# Patient Record
Sex: Male | Born: 1990 | Race: Black or African American | Hispanic: No | Marital: Married | Smoking: Current some day smoker
Health system: Southern US, Community
[De-identification: ages and names within clinical notes are randomized; demographics above are authoritative.]

## PROBLEM LIST (undated history)

## (undated) DIAGNOSIS — I10 Essential (primary) hypertension: Secondary | ICD-10-CM

---
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
## 2011-02-07 LAB — RAPID STREP SCREEN (MED CTR MEBANE ONLY): Streptococcus, Group A Screen (Direct): NEGATIVE

## 2012-02-29 ENCOUNTER — Emergency Department (HOSPITAL_COMMUNITY): Payer: Self-pay

## 2012-02-29 ENCOUNTER — Emergency Department (HOSPITAL_COMMUNITY)
Admission: EM | Admit: 2012-02-29 | Discharge: 2012-02-29 | Disposition: A | Discharge status: Home / Self Care | Payer: Self-pay | Attending: Emergency Medicine | Admitting: Emergency Medicine

## 2012-02-29 ENCOUNTER — Encounter (HOSPITAL_COMMUNITY): Payer: Self-pay | Admitting: Emergency Medicine

## 2012-02-29 DIAGNOSIS — F17200 Nicotine dependence, unspecified, uncomplicated: Secondary | ICD-10-CM | POA: Insufficient documentation

## 2012-02-29 DIAGNOSIS — J069 Acute upper respiratory infection, unspecified: Secondary | ICD-10-CM | POA: Insufficient documentation

## 2012-02-29 DIAGNOSIS — J40 Bronchitis, not specified as acute or chronic: Secondary | ICD-10-CM | POA: Insufficient documentation

## 2012-02-29 MED ORDER — AEROCHAMBER Z-STAT PLUS/MEDIUM MISC
1.0000 | Freq: Once | Status: AC
  Administered 2012-02-29: 1

## 2012-02-29 MED ORDER — ALBUTEROL SULFATE HFA 108 (90 BASE) MCG/ACT IN AERS
2.0000 | INHALATION_SPRAY | RESPIRATORY_TRACT | Status: DC | PRN
  Filled 2012-02-29: qty 6.7

## 2012-02-29 NOTE — ED Notes (Signed)
Pt presents w/ cough and cold Sx since Wednesday, has Rx'ed w/ cough syrup and chest congestion pills w/o relief. States feels like chest is "gonna explode when I cough", nasal secretions are yellowish green in color w/ recent frequent nose bleeds. Denies sorethroat, headache. Left ear pain when he blows his nose.

## 2012-02-29 NOTE — ED Provider Notes (Signed)
Medical screening examination/treatment/procedure(s) were performed by non-physician practitioner and as supervising physician I was immediately available for consultation/collaboration.  Raeford Razor, MD 02/29/12 [phone]

## 2012-02-29 NOTE — ED Provider Notes (Signed)
History     CSN: [id number]  Arrival date & time 02/29/12  1340   First MD Initiated Contact with Patient 02/29/12 1413      Chief Complaint  Patient presents with  . Cough  . Nasal Congestion    (Consider location/radiation/quality/duration/timing/severity/associated sxs/prior treatment) HPI Comments: Patient presents with complaint of productive cough, chest pain, and runny nose for the past 3 days. Patient has been taking over-the-counter cough medications and cold medications without relief. Patient states he has noted a small amount of blood when he blows his nose. No sore throat or headache. No nausea or vomiting. No fever. Left ear pain but only when he blows his nose. Onset gradual. Course is constant. Nothing makes symptoms better or worse.  The history is provided by the patient.    History reviewed. No pertinent past medical history.  History reviewed. No pertinent past surgical history.  No family history on file.  History  Substance Use Topics  . Smoking status: Current Every Day Smoker -- 0.5 packs/day    Types: Cigarettes  . Smokeless tobacco: Never Used  . Alcohol Use: 9.6 oz/week    16 Shots of liquor per week     vodka      Review of Systems  Constitutional: Negative for fever, chills and fatigue.  HENT: Positive for ear pain, nosebleeds, congestion and rhinorrhea. Negative for sore throat, neck stiffness and sinus pressure.   Eyes: Negative for redness.  Respiratory: Positive for cough and chest tightness. Negative for shortness of breath and wheezing.   Gastrointestinal: Negative for nausea, vomiting, abdominal pain and diarrhea.  Genitourinary: Negative for dysuria.  Musculoskeletal: Negative for myalgias.  Skin: Negative for rash.  Neurological: Negative for headaches.  Hematological: Negative for adenopathy.    Allergies  Other  Home Medications  No current outpatient prescriptions on file.  BP 136/82  Pulse 93  Temp 98.6 F (37 C)  (Oral)  SpO2 95%  Physical Exam  Nursing note and vitals reviewed. Constitutional: He appears well-developed and well-nourished.  HENT:  Head: Normocephalic and atraumatic. No trismus in the jaw.  Right Ear: Tympanic membrane, external ear and ear canal normal.  Left Ear: Tympanic membrane, external ear and ear canal normal.  Nose: Mucosal edema and rhinorrhea present.  Mouth/Throat: Uvula is midline, oropharynx is clear and moist and mucous membranes are normal. Mucous membranes are not dry. No uvula swelling. No oropharyngeal exudate, posterior oropharyngeal edema, posterior oropharyngeal erythema or tonsillar abscesses.  Eyes: Conjunctivae normal are normal. Right eye exhibits no discharge. Left eye exhibits no discharge.  Neck: Normal range of motion. Neck supple.  Cardiovascular: Normal rate, regular rhythm and normal heart sounds.   Pulmonary/Chest: Effort normal. No respiratory distress. He has wheezes. He has no rales.       Mild rhonchi and wheezing, worst left lower lobe.  Abdominal: Soft. There is no tenderness.  Neurological: He is alert.  Skin: Skin is warm and dry.  Psychiatric: He has a normal mood and affect.    ED Course  Procedures (including critical care time)  Labs Reviewed - No data to display Dg Chest 2 View  02/29/2012  *RADIOLOGY REPORT*  Clinical Data: Cough, cold symptoms  CHEST - 2 VIEW  Comparison: None.  Findings: Cardiomediastinal silhouette is unremarkable.  No acute infiltrate or pleural effusion.  No pulmonary edema.  Accessory azygos fissure/lobe is noted.  IMPRESSION: No active disease.   Original Report Authenticated By: Natasha Mead, M.D.      1.  Bronchitis   2. URI (upper respiratory infection)     2:26 PM Patient seen and examined. CXR ordered to eval PNA, LLL.   Vital signs reviewed and are as follows: Filed Vitals:   02/29/12 1406  BP: 136/82  Pulse: 93  Temp: 98.6 F (37 C)   X-ray/CT reviewed by myself. Radiologist report  reviewed. No acute process.   Patient counseled on use of albuterol HFA.  Told to use 1-2 puffs q 4 hours as needed for SOB.  Patient counseled on supportive care for viral URI and s/s to return including worsening symptoms, persistent fever, persistent vomiting, or if they have any other concerns.  Urged to see PCP if symptoms persist for more than 3 days. Patient verbalizes understanding and agrees with plan.     MDM  Patient with likely viral bronchitis. No concern for PNA given x-ray. Antibiotics not indicated. Conservative therapy indicated.           Renne Crigler, [place] 02/29/12 1513

## 2012-04-19 ENCOUNTER — Emergency Department (HOSPITAL_COMMUNITY)
Admission: EM | Admit: 2012-04-19 | Discharge: 2012-04-19 | Disposition: A | Discharge status: Home / Self Care | Payer: Self-pay | Attending: Emergency Medicine | Admitting: Emergency Medicine

## 2012-04-19 ENCOUNTER — Encounter (HOSPITAL_COMMUNITY): Payer: Self-pay

## 2012-04-19 ENCOUNTER — Emergency Department (HOSPITAL_COMMUNITY): Payer: Self-pay

## 2012-04-19 DIAGNOSIS — Y9289 Other specified places as the place of occurrence of the external cause: Secondary | ICD-10-CM | POA: Insufficient documentation

## 2012-04-19 DIAGNOSIS — F17200 Nicotine dependence, unspecified, uncomplicated: Secondary | ICD-10-CM | POA: Insufficient documentation

## 2012-04-19 DIAGNOSIS — M25511 Pain in right shoulder: Secondary | ICD-10-CM

## 2012-04-19 DIAGNOSIS — Y9351 Activity, roller skating (inline) and skateboarding: Secondary | ICD-10-CM | POA: Insufficient documentation

## 2012-04-19 DIAGNOSIS — S46909A Unspecified injury of unspecified muscle, fascia and tendon at shoulder and upper arm level, unspecified arm, initial encounter: Secondary | ICD-10-CM | POA: Insufficient documentation

## 2012-04-19 DIAGNOSIS — S4980XA Other specified injuries of shoulder and upper arm, unspecified arm, initial encounter: Secondary | ICD-10-CM | POA: Insufficient documentation

## 2012-04-19 MED ORDER — NAPROXEN 500 MG PO TABS: 500.0000 mg | ORAL_TABLET | Freq: Two times a day (BID) | ORAL | Status: DC

## 2012-04-19 NOTE — ED Notes (Signed)
Patient reports that he was roller skating and fell, landing on his right arm. Positive right radial pulse. Able to wiggle right fingers without any problems.

## 2012-04-19 NOTE — ED Provider Notes (Signed)
History     CSN: [id number]  Arrival date & time 04/19/12  1746   First MD Initiated Contact with Patient 04/19/12 1817      Chief Complaint  Patient presents with  . Arm Injury    (Consider location/radiation/quality/duration/timing/severity/associated sxs/prior treatment) HPI Alan Lamb is a 21 y.o. male who presents with complaint of right shoulder pain. Stats he was roller skating at a rink yesterday and states he stripped over another person and fell down landing on right shoulder. States pain to the shoulder, pain with ROM of the shoulder. Denies pain in elbow or wrist. Did not hit his head. Did not ice or take anything for the pain.   History reviewed. No pertinent past medical history.  History reviewed. No pertinent past surgical history.  No family history on file.  History  Substance Use Topics  . Smoking status: Current Some Day Smoker -- 0.5 packs/day    Types: Cigarettes  . Smokeless tobacco: Never Used  . Alcohol Use: 0.0 oz/week     Comment: occasional      Review of Systems  Constitutional: Negative for fever and chills.  HENT: Negative for neck pain.   Respiratory: Negative.   Cardiovascular: Negative.   Musculoskeletal: Positive for arthralgias. Negative for joint swelling.  Neurological: Negative for weakness and numbness.    Allergies  Other  Home Medications  No current outpatient prescriptions on file.  BP 139/69  Pulse 81  Temp 98.4 F (36.9 C) (Oral)  Resp 16  SpO2 100%  Physical Exam  Nursing note and vitals reviewed. Constitutional: He appears well-developed and well-nourished. No distress.  Eyes: Conjunctivae normal are normal.  Neck: Normal range of motion. Neck supple.  Cardiovascular: Normal rate, regular rhythm and normal heart sounds.   Pulmonary/Chest: Effort normal and breath sounds normal. No respiratory distress. He has no wheezes. He has no rales.  Musculoskeletal:       Right shoulder normal in appearance.  No bruising, swelling, deformity. Tender to palpation over trapezius muscle, anterior and posterior joints. Pain with ROM of the right tshoulder joint in all directions as well as with internal and external rotation. Full ROM of the joint. Good strength of bicep, triceps, deltoid muscle. Radial pulse normal.   Neurological: He is alert.  Skin: Skin is warm and dry.    ED Course  Procedures (including critical care time)  Labs Reviewed - No data to display Dg Shoulder Right  04/19/2012  *RADIOLOGY REPORT*  Clinical Data: Right shoulder pain.  RIGHT SHOULDER - 2+ VIEW  Comparison: None  Findings: The joint spaces are maintained.  No acute bony findings or abnormal soft tissue calcifications.  The right lung apex is clear.  IMPRESSION: No fracture or dislocation.   Original Report Authenticated By: Rudie Meyer, M.D.      1. Right shoulder pain       MDM  Pt  Post fall yesterday. Today shoulder pain, joint stable. X-ray negative. Will treat with Ice, NSAIDs, follow up as needed.         Lottie Mussel, PA 04/19/12 2342

## 2012-04-22 NOTE — ED Provider Notes (Signed)
Medical screening examination/treatment/procedure(s) were performed by non-physician practitioner and as supervising physician I was immediately available for consultation/collaboration.   Adir Schicker H Savahna Casados, MD 04/22/12 1105 

## 2014-03-04 ENCOUNTER — Emergency Department (HOSPITAL_COMMUNITY)
Admission: EM | Admit: 2014-03-04 | Discharge: 2014-03-04 | Disposition: A | Discharge status: Home / Self Care | Payer: Self-pay | Attending: Emergency Medicine | Admitting: Emergency Medicine

## 2014-03-04 ENCOUNTER — Encounter (HOSPITAL_COMMUNITY): Payer: Self-pay | Admitting: Emergency Medicine

## 2014-03-04 DIAGNOSIS — R109 Unspecified abdominal pain: Secondary | ICD-10-CM | POA: Insufficient documentation

## 2014-03-04 DIAGNOSIS — Z720 Tobacco use: Secondary | ICD-10-CM | POA: Insufficient documentation

## 2014-03-04 DIAGNOSIS — R1110 Vomiting, unspecified: Secondary | ICD-10-CM | POA: Insufficient documentation

## 2014-03-04 NOTE — ED Notes (Signed)
Pt is c/o abd pain on the left side that started about 1 pm Thursday afternoon  Pt states he had one episode of vomiting  Describes the pain as sharp in nature and varies in intensity from 6-9 on pain scale

## 2014-03-04 NOTE — ED Notes (Signed)
Pt still not in room or restroom, will discharge.

## 2014-03-04 NOTE — ED Notes (Signed)
Pt not in room, will recheck shortly to ask for urine sample

## 2014-05-18 ENCOUNTER — Emergency Department (HOSPITAL_COMMUNITY)
Admission: EM | Admit: 2014-05-18 | Discharge: 2014-05-19 | Disposition: A | Discharge status: Home / Self Care | Payer: Self-pay | Attending: Emergency Medicine | Admitting: Emergency Medicine

## 2014-05-18 DIAGNOSIS — K0889 Other specified disorders of teeth and supporting structures: Secondary | ICD-10-CM

## 2014-05-18 DIAGNOSIS — K088 Other specified disorders of teeth and supporting structures: Secondary | ICD-10-CM | POA: Insufficient documentation

## 2014-05-18 DIAGNOSIS — Z7982 Long term (current) use of aspirin: Secondary | ICD-10-CM | POA: Insufficient documentation

## 2014-05-18 DIAGNOSIS — Z720 Tobacco use: Secondary | ICD-10-CM | POA: Insufficient documentation

## 2014-05-19 ENCOUNTER — Encounter (HOSPITAL_COMMUNITY): Payer: Self-pay

## 2014-05-19 MED ORDER — IBUPROFEN 600 MG PO TABS: 600.0000 mg | ORAL_TABLET | Freq: Four times a day (QID) | ORAL | Status: DC | PRN

## 2014-05-19 MED ORDER — AMOXICILLIN 500 MG PO CAPS: 500.0000 mg | ORAL_CAPSULE | Freq: Three times a day (TID) | ORAL | Status: DC

## 2014-05-19 MED ORDER — HYDROCODONE-ACETAMINOPHEN 5-325 MG PO TABS: 1.0000 | ORAL_TABLET | ORAL | Status: DC | PRN

## 2014-05-19 MED ORDER — OXYCODONE-ACETAMINOPHEN 5-325 MG PO TABS
1.0000 | ORAL_TABLET | Freq: Once | ORAL | Status: AC
  Administered 2014-05-19: 1 via ORAL
  Filled 2014-05-19: qty 1

## 2014-05-19 NOTE — ED Provider Notes (Signed)
CSN: [id number]     Arrival date & time 05/18/14  2352 History   First MD Initiated Contact with Patient 05/19/14 0006     Chief Complaint  Patient presents with  . Dental Pain     (Consider location/radiation/quality/duration/timing/severity/associated sxs/prior Treatment) HPI Alan Lamb is a 24 y.o. male with no medical problems presents to emergency department complaining of dental pain. He states pain started several days ago. States pain is in the left lower molar, radiating all over left face. Denies any fever or chills. No facial swelling. No swelling of the tongue. Has been taking aspirin for pain with no relief. He does not have a dentist. No fever or chills. No dental injuries. No other complaints.  History reviewed. No pertinent past medical history. History reviewed. No pertinent past surgical history. Family History  Problem Relation Age of Onset  . Hypertension Other    History  Substance Use Topics  . Smoking status: Current Some Day Smoker -- 0.50 packs/day    Types: Cigarettes  . Smokeless tobacco: Never Used  . Alcohol Use: 0.0 oz/week     Comment: occasional    Review of Systems  Constitutional: Negative for fever and chills.  HENT: Positive for dental problem. Negative for facial swelling.   Respiratory: Negative for cough, chest tightness and shortness of breath.   Cardiovascular: Negative for chest pain, palpitations and leg swelling.  Genitourinary: Negative for hematuria.  Musculoskeletal: Negative for myalgias, arthralgias, neck pain and neck stiffness.  Skin: Negative for rash.  Allergic/Immunologic: Negative for immunocompromised state.  All other systems reviewed and are negative.     Allergies  Other  Home Medications   Prior to Admission medications   Medication Sig Start Date End Date Taking? Authorizing Provider  amoxicillin (AMOXIL) 500 MG capsule Take 1 capsule (500 mg total) by mouth 3 (three) times daily. 05/19/14   Capri Veals A  Cristhian Vanhook, PA-C  aspirin 325 MG tablet Take 325 mg by mouth once.    Historical Provider, MD  HYDROcodone-acetaminophen (NORCO/VICODIN) 5-325 MG per tablet Take 1-2 tablets by mouth every 4 (four) hours as needed for moderate pain or severe pain. 05/19/14   Mirakle Tomlin A Ruth Kovich, PA-C  ibuprofen (ADVIL,MOTRIN) 600 MG tablet Take 1 tablet (600 mg total) by mouth every 6 (six) hours as needed. 05/19/14   Diogenes Whirley A Benjamin Casanas, PA-C   BP 145/62 mmHg  Pulse 66  Temp(Src) 98.2 F (36.8 C) (Oral)  Resp 20  Ht  (1.778 m)  Wt 160 lb (72.576 kg)  BMI 22.96 kg/m2  SpO2 96% Physical Exam  Constitutional: He is oriented to person, place, and time. He appears well-developed and well-nourished. No distress.  HENT:  Head: Normocephalic and atraumatic.  Good dentition. Left lower third molar coming in, pushing against the second molar. Surrounding gum erythema. Tender to palpation. No swelling. Nofilling of the tongue. No trismus.  Eyes: Conjunctivae are normal.  Neck: Neck supple.  Cardiovascular: Normal rate, regular rhythm and normal heart sounds.   Pulmonary/Chest: Effort normal. No respiratory distress. He has no wheezes. He has no rales.  Musculoskeletal: He exhibits no edema.  Lymphadenopathy:    He has no cervical adenopathy.  Neurological: He is alert and oriented to person, place, and time.  Skin: Skin is warm and dry.  Nursing note and vitals reviewed.   ED Course  Procedures (including critical care time) Labs Review Labs Reviewed - No data to display  Imaging Review No results found.   EKG Interpretation None  MDM   Final diagnoses:  Pain, dental    Patient with dental pain, most likely from impacted left lower third molar. Possible early infection. Will start amoxicillin. Depression and Norco for pain. Follow-up with a dentist. Referral is given. At this time no evidence of Ludwig's angina.  Filed Vitals:   05/19/14 0000  BP: 145/62  Pulse: 66  Temp: 98.2 F  (36.8 C)  TempSrc: Oral  Resp: 20  Height: 5\' 10"  (1.778 m)  Weight: 160 lb (72.576 kg)  SpO2: 96%     Lottie Musselatyana A Cloie Wooden, PA-C 05/19/14 0041  Carlisle BeersJohn L Molpus, MD 05/19/14 [phone]

## 2014-05-19 NOTE — Discharge Instructions (Signed)
Ibuprofen for pain. norco for severe pain. Rinse mouth after eating. Amoxil until all gone. Follow up with a dentist.    Emergency Department Resource Guide 1) Find a Doctor and Pay Out of Pocket Although you won't have to find out who is covered by your insurance plan, it is a good idea to ask around and get recommendations. You will then need to call the office and see if the doctor you have chosen will accept you as a new patient and what types of options they offer for patients who are self-pay. Some doctors offer discounts or will set up payment plans for their patients who do not have insurance, but you will need to ask so you aren't surprised when you get to your appointment.  2) Contact Your Local Health Department Not all health departments have doctors that can see patients for sick visits, but many do, so it is worth a call to see if yours does. If you don't know where your local health department is, you can check in your phone book. The CDC also has a tool to help you locate your state's health department, and many state websites also have listings of all of their local health departments.  3) Find a Walk-in Clinic If your illness is not likely to be very severe or complicated, you may want to try a walk in clinic. These are popping up all over the country in pharmacies, drugstores, and shopping centers. They're usually staffed by nurse practitioners or physician assistants that have been trained to treat common illnesses and complaints. They're usually fairly quick and inexpensive. However, if you have serious medical issues or chronic medical problems, these are probably not your best option.  No Primary Care Doctor: - Call Health Connect at  [phone] - they can help you locate a primary care doctor that  accepts your insurance, provides certain services, etc. - Physician Referral Service- [phone]  Chronic Pain Problems: Organization         Address  Phone   Notes  [facility]  [phone] Patients need to be referred by their primary care doctor.   Medication Assistance: Organization         Address  Phone   Notes  [facility] [address] [place], [place] [address] [phone] --Must be a resident of [facility] [place] -- Must have NO insurance coverage whatsoever (no Medicaid/ Medicare, etc.) -- The pt. MUST have a primary care doctor that directs their care regularly and follows them in the community   MedAssist  [phone]   [employer]  [phone]    Agencies that provide inexpensive medical care: Organization         Address  Phone   Notes  [facility]  [phone]   [facility]    [phone]   [facility] [address] [place], [place] [address] [phone]   [facility] [address], [place] [phone]   [facility]anned [facility]    [phone]   [facility]    [phone]   [facility] and [facility]  [address], [place] Phone:  [phone], Fax:  [phone] Hours of Operation:  9 am - 6 pm, M-F.  Also accepts Medicaid/Medicare and self-pay.  [facility]  [address], [place] Phone: [phone], Fax: [phone]. Hours of Operation:  8:30 am - 5:30 pm, M-F.  Also accepts Medicaid and self-pay.  [facility] [place] [address], [place] Phone: [phone]   [place], [place], [place] [phone], Ext. [phone] Mondays & Thursdays: 7-9 AM.  First 15 patients are seen on a first come, first serve basis.    [place] Providers:  Organization         Address  Phone   Notes  [facility] [address], [place] [phone] Also accepts self-pay patients.  [facility] [address], [place]  [phone]   [place], [address], [place] [phone]   [facility] [address] [place] [phone]   Lucianne Lei [address], [place]   [phone] Only accepts [place] Access [place] patients after they have their name applied to their card.   Self-Pay (no insurance) in [facility]:  Organization         Address  Phone   Notes  Sickle Cell Patients, [facility] [place] [phone]   [facility] [place] [phone]   [facility] [place]  [place], [place], [place] [phone]   [facility]/Dr. Osei-Bonsu  [address], [place] or [address], [place] [phone] Phone number for both [place] and [place] locations is the same.  Urgent Medical and [facility] [address], [place] [phone]   [facility] [address] [place] or [address] [phone]   [facility] [address] [place] [phone], phone; [phone], fax Sees patients 1st and 3rd Saturday of every month.  Must not qualify for public or private insurance (i.e. Medicaid, Medicare, [place] Health Choice, Veterans' Benefits)  Household income should be no more than 200% of the poverty level The clinic cannot treat you if you are pregnant or think you are pregnant  Sexually transmitted diseases are not treated at the clinic.    Dental Care: Organization         Address  Phone  Notes  [facility] of [facility] [place] [phone] Accepts children up to age 37 who are enrolled in [place] or [place]; pregnant women with a Medicaid card; and children who have applied for  Medicaid or [place] Health Choice, but were declined, whose parents can pay a reduced fee at time of service.  [facility] of [facility]  [address], [place] [phone] Accepts children up to age 26 who are enrolled in [place] or [place]; pregnant women with a Medicaid card; and children who have applied for Medicaid or [place] Health Choice, but were declined, whose parents can pay a reduced fee at time of service.  [facility]  [place] [phone] Patients are seen by appointment only. Walk-ins are not accepted. [place] will see patients 43 years of age and older. Monday - Tuesday (8am-5pm) Most Wednesdays (8:30-5pm) $30 per visit, cash only  [place] [facility] [employer] PRO[facility]  [address], [employer] [phone])  [phone] Patients are seen by appointment only. Walk-ins are not accepted. [place] will see patients 34 years of age and older. One Wednesday Evening (Monthly: Volunteer Based).  $30 per visit, cash only  [place]  [phone] for adults; Children under age 57, call Graduate Pediatric Dentistry at [phone]. Children aged 16-14, please call [phone] to request a pediatric application.  Dental services are provided in all areas of dental care including fillings, crowns and bridges, complete and partial dentures, implants, gum treatment, root canals, and extractions. Preventive care is also provided. Treatment is provided to both adults and children. Patients are selected via a lottery and there is often a waiting list.   [facility] [address] [place]  [phone] [email or web address]   [facility] [address] [place], [place] [phone], Ext. [phone] Second and Fourth Thursday of each month, opens at 6:30 AM; Clinic ends at 9 AM.  Patients are seen on a first-come first-served basis, and a limited number  are seen during each clinic.   [facility]  [address] Hillard Danker [place], [place] [phone]   Eligibility Requirements You must have lived in [place], [place], or [place] counties for at least the last three months.   You cannot be eligible for state or federal sponsored [employer], including [employer], [place], or [employer].   You generally cannot be eligible for healthcare insurance through your employer.    How to apply: Eligibility screenings are held every Tuesday and Wednesday afternoon from 1:00 pm until 4:00 pm. You do not need an appointment for the interview!  [facility] [address] [place], [place]   [place]  [place] Department  [place]  [phone]    Behavioral Health Resources in the Community: Intensive Outpatient Programs Organization         Address  Phone  Notes  [facility]. [address], [place], [place] [phone]   [facility] [address] [place], [place]   ADS: Alcohol & Drug Svcs [address], [place], [place]   [place] [address]  [place], [place] or [phone]   Substance Abuse Resources Organization         Address  Phone  Notes  Alcohol and Drug Services  [phone]   [place]  [phone]   [facility] [place]   Chinita Pester  [phone]   Residential & Outpatient Substance Abuse Program  [phone]   Psychological Services Organization         Address  Phone  Notes  [facility] [place]  [place]  [phone]   [place] [address] [place] or [phone]    Mobile Crisis Teams Organization         Address  Phone  Notes  Therapeutic  Alternatives, Mobile Crisis Care Unit  [phone]   Assertive Psychotherapeutic Services  [address] [place], [place]   [place] Levels [address] [place] [phone]    Self-Help/Support Groups Organization         Address  Phone             Notes  [place]. of [place] - variety of support groups  [phone]  [phone] Call for more information  Narcotics Anonymous (NA), [facility] [address], [employer] [place]  2 meetings at this location   Residential Facilities manager         Address  Phone  Notes  [facility] [place],    [place]  [phone]   [facility]  [address], [place] [address], [place], [place]   [place], [place] [phone] Admissions: 8am-3pm M-F  Incentives Substance [place] [address]    [place], [place] [phone]   [facility] [address] [place], [place], [place]   [facility] [address]  [place], [place]   [facility]ight Programs - Intensive Outpatient [address], [place], [place]   [facility] ([place].,  [place], [place] [phone] or [phone]   [facility] (RT[facility]) [address], [place], [place] Accepts Medicaid  [facility] [place] [address]  [place] [phone] Substance Abuse/Addiction Treatment   [facility] Organization         Address  Phone  Notes  [facility]  [phone]   Domenic Schwab, PhD [address] Arlis Porta [place], [place]   [phone] or [phone]   [place], [place] [phone]   [facility] [address] [place], [place] [phone] Insurance/Medicaid/sponsorship through [facility] and Families  [address] [place]                                    [place], [place] [phone] [place] [address] [place], [place] [phone]    Dr. Adele Schilder  [phone]   [facility] Dept. 1) [address], [place] 2) [place] 3)  [place] [address], [place] [phone]  [phone]   [place] [phone] or [phone] (After Hours)

## 2014-05-19 NOTE — ED Notes (Signed)
Pt complains of dental pain since Monday

## 2014-09-29 ENCOUNTER — Emergency Department (INDEPENDENT_AMBULATORY_CARE_PROVIDER_SITE_OTHER)
Admission: EM | Admit: 2014-09-29 | Discharge: 2014-09-29 | Disposition: A | Discharge status: Home / Self Care | Payer: Self-pay | Source: Home / Self Care | Attending: Emergency Medicine | Admitting: Emergency Medicine

## 2014-09-29 ENCOUNTER — Encounter (HOSPITAL_COMMUNITY): Payer: Self-pay | Admitting: Emergency Medicine

## 2014-09-29 DIAGNOSIS — K0889 Other specified disorders of teeth and supporting structures: Secondary | ICD-10-CM

## 2014-09-29 DIAGNOSIS — K088 Other specified disorders of teeth and supporting structures: Secondary | ICD-10-CM

## 2014-09-29 MED ORDER — HYDROCODONE-ACETAMINOPHEN 5-325 MG PO TABS: 1.0000 | ORAL_TABLET | ORAL | Status: DC | PRN

## 2014-09-29 MED ORDER — IBUPROFEN 600 MG PO TABS: 600.0000 mg | ORAL_TABLET | Freq: Four times a day (QID) | ORAL | Status: DC | PRN

## 2014-09-29 MED ORDER — AMOXICILLIN 500 MG PO CAPS: 500.0000 mg | ORAL_CAPSULE | Freq: Three times a day (TID) | ORAL | Status: DC

## 2014-09-29 NOTE — ED Provider Notes (Signed)
CSN: [id number]     Arrival date & time 09/29/14  1649 History   First MD Initiated Contact with Patient 09/29/14 1725     Chief Complaint  Patient presents with  . Dental Pain   (Consider location/radiation/quality/duration/timing/severity/associated sxs/prior Treatment) HPI  He is a 24 year old man here for evaluation of dental pain. He states this started last night. It is located in the left upper jaw. It is worse this morning. He now has swelling at the left upper jaw. He denies any fevers or difficulty swallowing. He does not have insurance or a Education officer, communitydentist.  History reviewed. No pertinent past medical history. History reviewed. No pertinent past surgical history. Family History  Problem Relation Age of Onset  . Hypertension Other    History  Substance Use Topics  . Smoking status: Current Some Day Smoker -- 0.50 packs/day    Types: Cigarettes  . Smokeless tobacco: Never Used  . Alcohol Use: 0.0 oz/week     Comment: occasional    Review of Systems As in history of present illness Allergies  Other  Home Medications   Prior to Admission medications   Medication Sig Start Date End Date Taking? Authorizing Provider  amoxicillin (AMOXIL) 500 MG capsule Take 1 capsule (500 mg total) by mouth 3 (three) times daily. 09/29/14   Charm RingsErin J Anacleto Batterman, MD  aspirin 325 MG tablet Take 325 mg by mouth once.    Historical Provider, MD  HYDROcodone-acetaminophen (NORCO/VICODIN) 5-325 MG per tablet Take 1-2 tablets by mouth every 4 (four) hours as needed for moderate pain or severe pain. 09/29/14   Charm RingsErin J Ivylynn Hoppes, MD  ibuprofen (ADVIL,MOTRIN) 600 MG tablet Take 1 tablet (600 mg total) by mouth every 6 (six) hours as needed. 09/29/14   Charm RingsErin J Zameer Borman, MD   BP 130/78 mmHg  Pulse 80  Temp(Src) 98.5 F (36.9 C) (Oral)  Resp 17  SpO2 92% Physical Exam  Constitutional: He is oriented to person, place, and time. He appears well-developed and well-nourished. No distress.  HENT:  Mouth/Throat:     Swelling of left upper jaw.  Cardiovascular: Normal rate.   Pulmonary/Chest: Effort normal.  Neurological: He is alert and oriented to person, place, and time.    ED Course  Procedures (including critical care time) Labs Review Labs Reviewed - No data to display  Imaging Review No results found.   MDM   1. Pain, dental    We'll treat with amoxicillin, ibuprofen, Norco. Handout on low cost dental resources given.    Charm RingsErin J Adamaris King, MD 09/29/14 [phone]

## 2014-09-29 NOTE — ED Notes (Signed)
Pt is having pain in his left upper gum area that started last night.  The pt has left facial swelling.

## 2014-09-29 NOTE — Discharge Instructions (Signed)
Dental Pain  Toothache is pain in or around a tooth. It may get worse with chewing or with cold or heat.   HOME CARE  · Your dentist may use a numbing medicine during treatment. If so, you may need to avoid eating until the medicine wears off. Ask your dentist about this.  · Only take medicine as told by your dentist or doctor.  · Avoid chewing food near the painful tooth until after all treatment is done. Ask your dentist about this.  GET HELP RIGHT AWAY IF:   · The problem gets worse or new problems appear.  · You have a fever.  · There is redness and puffiness (swelling) of the face, jaw, or neck.  · You cannot open your mouth.  · There is pain in the jaw.  · There is very bad pain that is not helped by medicine.  MAKE SURE YOU:   · Understand these instructions.  · Will watch your condition.  · Will get help right away if you are not doing well or get worse.  Document Released: 10/16/2007 Document Revised: 07/22/2011 Document Reviewed: 10/16/2007  ExitCare® Patient Information ©2015 [employer]. This information is not intended to replace advice given to you by your health care provider. Make sure you discuss any questions you have with your health care provider.

## 2015-05-10 ENCOUNTER — Emergency Department (HOSPITAL_COMMUNITY)
Admission: EM | Admit: 2015-05-10 | Discharge: 2015-05-11 | Disposition: A | Discharge status: Home / Self Care | Payer: Self-pay | Attending: Emergency Medicine | Admitting: Emergency Medicine

## 2015-05-10 DIAGNOSIS — F17210 Nicotine dependence, cigarettes, uncomplicated: Secondary | ICD-10-CM | POA: Insufficient documentation

## 2015-05-10 DIAGNOSIS — R1084 Generalized abdominal pain: Secondary | ICD-10-CM | POA: Insufficient documentation

## 2015-05-10 DIAGNOSIS — R112 Nausea with vomiting, unspecified: Secondary | ICD-10-CM | POA: Insufficient documentation

## 2015-05-10 DIAGNOSIS — I10 Essential (primary) hypertension: Secondary | ICD-10-CM | POA: Insufficient documentation

## 2015-05-10 DIAGNOSIS — R197 Diarrhea, unspecified: Secondary | ICD-10-CM | POA: Insufficient documentation

## 2015-05-10 HISTORY — DX: Essential (primary) hypertension: I10

## 2015-05-11 ENCOUNTER — Encounter (HOSPITAL_COMMUNITY): Payer: Self-pay | Admitting: Emergency Medicine

## 2015-05-11 ENCOUNTER — Emergency Department (HOSPITAL_COMMUNITY): Payer: Self-pay

## 2015-05-11 LAB — COMPREHENSIVE METABOLIC PANEL
ALK PHOS: 73 U/L (ref 38–126)
ALT: 36 U/L (ref 17–63)
AST: 26 U/L (ref 15–41)
Albumin: 4.9 g/dL (ref 3.5–5.0)
Anion gap: 12 (ref 5–15)
BILIRUBIN TOTAL: 1.2 mg/dL (ref 0.3–1.2)
BUN: 17 mg/dL (ref 6–20)
CO2: 29 mmol/L (ref 22–32)
Calcium: 9.9 mg/dL (ref 8.9–10.3)
Chloride: 98 mmol/L — ABNORMAL LOW (ref 101–111)
Creatinine, Ser: 1 mg/dL (ref 0.61–1.24)
GFR calc Af Amer: >60 mL/min (ref >60)
GFR calc non Af Amer: >60 mL/min (ref >60)
Glucose, Bld: 107 mg/dL — ABNORMAL HIGH (ref 65–99)
POTASSIUM: 4.1 mmol/L (ref 3.5–5.1)
Sodium: 139 mmol/L (ref 135–145)
TOTAL PROTEIN: 8.1 g/dL (ref 6.5–8.1)

## 2015-05-11 LAB — CBC
HEMATOCRIT: 49.7 % (ref 39.0–52.0)
Hemoglobin: 17.5 g/dL — ABNORMAL HIGH (ref 13.0–17.0)
MCH: 31.6 pg (ref 26.0–34.0)
MCHC: 35.2 g/dL (ref 30.0–36.0)
MCV: 89.9 fL (ref 78.0–100.0)
Platelets: 225 10*3/uL (ref 150–400)
RBC: 5.53 MIL/uL (ref 4.22–5.81)
RDW: 13 % (ref 11.5–15.5)
WBC: 15.4 10*3/uL — ABNORMAL HIGH (ref 4.0–10.5)

## 2015-05-11 LAB — LIPASE, BLOOD: Lipase: 29 U/L (ref 11–51)

## 2015-05-11 MED ORDER — ONDANSETRON HCL 4 MG PO TABS: 4.0000 mg | ORAL_TABLET | Freq: Three times a day (TID) | ORAL | Status: AC | PRN

## 2015-05-11 MED ORDER — MORPHINE SULFATE (PF) 4 MG/ML IV SOLN
4.0000 mg | Freq: Once | INTRAVENOUS | Status: AC
  Administered 2015-05-11: 4 mg via INTRAVENOUS
  Filled 2015-05-11: qty 1

## 2015-05-11 MED ORDER — IOHEXOL 300 MG/ML  SOLN
100.0000 mL | Freq: Once | INTRAMUSCULAR | Status: AC | PRN
  Administered 2015-05-11: 100 mL via INTRAVENOUS

## 2015-05-11 MED ORDER — ONDANSETRON HCL 4 MG/2ML IJ SOLN
4.0000 mg | Freq: Once | INTRAMUSCULAR | Status: AC
  Administered 2015-05-11: 4 mg via INTRAVENOUS
  Filled 2015-05-11: qty 2

## 2015-05-11 MED ORDER — SODIUM CHLORIDE 0.9 % IV BOLUS (SEPSIS): 1000.0000 mL | Freq: Once | INTRAVENOUS | Status: DC

## 2015-05-11 MED ORDER — FAMOTIDINE IN NACL 20-0.9 MG/50ML-% IV SOLN
20.0000 mg | Freq: Once | INTRAVENOUS | Status: AC
  Administered 2015-05-11: 20 mg via INTRAVENOUS
  Filled 2015-05-11: qty 50

## 2015-05-11 MED ORDER — DICYCLOMINE HCL 20 MG PO TABS: 20.0000 mg | ORAL_TABLET | Freq: Two times a day (BID) | ORAL | Status: AC

## 2015-05-11 MED ORDER — SODIUM CHLORIDE 0.9 % IV BOLUS (SEPSIS)
1000.0000 mL | Freq: Once | INTRAVENOUS | Status: AC
  Administered 2015-05-11: 1000 mL via INTRAVENOUS

## 2015-05-11 MED ORDER — IOHEXOL 300 MG/ML  SOLN
25.0000 mL | Freq: Once | INTRAMUSCULAR | Status: AC | PRN
  Administered 2015-05-11: 25 mL via ORAL

## 2015-05-11 MED ORDER — FAMOTIDINE 20 MG PO TABS: 20.0000 mg | ORAL_TABLET | Freq: Two times a day (BID) | ORAL | Status: AC

## 2015-05-11 NOTE — Discharge Instructions (Signed)
Push fluids: take small frequent sips of water or Gatorade, do not drink any soda, juice or caffeinated beverages.    Slowly resume solid diet as desired. Avoid food that are spicy, contain dairy and/or have high fat content.  Aviod NSAIDs (aspirin, motrin, ibuprofen, naproxen, Aleve et Karie Soda) for pain control because they will irritate your stomach.  Do not hesitate to return to the emergency room for any new, worsening or concerning symptoms.  Please obtain primary care using resource guide below. Let them know that you were seen in the emergency room and that they will need to obtain records for further outpatient management.   Abdominal Pain, Adult Many things can cause belly (abdominal) pain. Most times, the belly pain is not dangerous. Many cases of belly pain can be watched and treated at home. HOME CARE   Do not take medicines that help you go poop (laxatives) unless told to by your doctor.  Only take medicine as told by your doctor.  Eat or drink as told by your doctor. Your doctor will tell you if you should be on a special diet. GET HELP IF:  You do not know what is causing your belly pain.  You have belly pain while you are sick to your stomach (nauseous) or have runny poop (diarrhea).  You have pain while you pee or poop.  Your belly pain wakes you up at night.  You have belly pain that gets worse or better when you eat.  You have belly pain that gets worse when you eat fatty foods.  You have a fever. GET HELP RIGHT AWAY IF:   The pain does not go away within 2 hours.  You keep throwing up (vomiting).  The pain changes and is only in the right or left part of the belly.  You have bloody or tarry looking poop. MAKE SURE YOU:   Understand these instructions.  Will watch your condition.  Will get help right away if you are not doing well or get worse.   This information is not intended to replace advice given to you by your health care provider. Make  sure you discuss any questions you have with your health care provider.   Document Released: 10/16/2007 Document Revised: 05/20/2014 Document Reviewed: 01/06/2013 Elsevier Interactive Patient Education 2016 [employer].   Emergency Department Resource Guide 1) Find a Doctor and Pay Out of Pocket Although you won't have to find out who is covered by your insurance plan, it is a good idea to ask around and get recommendations. You will then need to call the office and see if the doctor you have chosen will accept you as a new patient and what types of options they offer for patients who are self-pay. Some doctors offer discounts or will set up payment plans for their patients who do not have insurance, but you will need to ask so you aren't surprised when you get to your appointment.  2) Contact Your Local Health Department Not all health departments have doctors that can see patients for sick visits, but many do, so it is worth a call to see if yours does. If you don't know where your local health department is, you can check in your phone book. The CDC also has a tool to help you locate your state's health department, and many state websites also have listings of all of their local health departments.  3) Find a Walk-in Clinic If your illness is not likely to be very severe  or complicated, you may want to try a walk in clinic. These are popping up all over the country in pharmacies, drugstores, and shopping centers. They're usually staffed by nurse practitioners or physician assistants that have been trained to treat common illnesses and complaints. They're usually fairly quick and inexpensive. However, if you have serious medical issues or chronic medical problems, these are probably not your best option.  No Primary Care Doctor: - Call Health Connect at  [phone] - they can help you locate a primary care doctor that  accepts your insurance, provides certain services, etc. - Physician Referral  Service- [phone]  Chronic Pain Problems: Organization         Address  Phone   Notes  [facility]  [phone] Patients need to be referred by their primary care doctor.   Medication Assistance: Organization         Address  Phone   Notes  [facility] Medication [facility] [address] [place] [place], [place] [address] [phone] --Must be a resident of [facility] -- Must have NO insurance coverage whatsoever (no Medicaid/ Medicare, etc.) -- The pt. MUST have a primary care doctor that directs their care regularly and follows them in the community   [facility]  [phone]   [employer]  [phone]    Agencies that provide inexpensive medical care: Organization         Address  Phone   Notes  [facility]  [phone]   [facility]    [phone]   [facility] [address] [place], [place] [address] [phone]   [facility] [address] [place] [place] [phone]   Planned Parenthood    [phone]   [facility]    [phone]   [facility]  [address], [place] Phone:  [phone], Fax:  [phone] Hours of Operation:  9 am - 6 pm, M-F.  Also accepts Medicaid/Medicare and self-pay.  [facility]  [address], [place] Phone: [phone], Fax: [phone]. Hours of Operation:  8:30 am - 5:30 pm, M-F.  Also accepts Medicaid and self-pay.  [facility] [place] [address], [place] Phone: [phone]   [facility] [address] Natasha Bence [place], [place] [phone], Ext. [phone] Mondays & Thursdays: 7-9 AM.  First 15 patients are seen on a first come, first serve basis.    Medicaid-accepting [facility] Providers:  Organization         Address  Phone   Notes  [facility] [address], [place] [phone] Also accepts self-pay patients.  [facility] [address] Laurell Josephs [place], [place]  [phone]   [facility] [address], [place] [phone]   [facility] [address] [place] [phone]   Renaye Rakers [address], [place]   [phone] Only accepts [place] Access [place] patients after they have their name applied to their card.   Self-Pay (no insurance) in [facility]:  Organization         Address  Phone   Notes  Sickle Cell Patients, [facility] [address] [place], [place] [phone]   [facility] [address] [place] [phone]   [facility] [place]  [address], [place] [phone]   [facility]/Dr. Osei-Bonsu  [address] [place] or [address], [place] [phone] Phone number for both [place] and [place] locations is the same.  [facility] [address] [place] [phone]   [facility] [address] [place] or [address] [phone]   [facility] [address] [place] [phone], phone; [phone], fax Sees patients 1st and 3rd Saturday of every month.  Must not qualify for public or private insurance (i.e. Medicaid, Medicare, [place] Health Choice, Veterans' Benefits)  Household income should be no more than 200% of the poverty level The clinic cannot treat you if you are pregnant or think you are pregnant  Sexually transmitted diseases are not treated at the clinic.    Dental Care: Organization         Address  Phone  Notes  [facility] Department of [facility] [address] [place], [place] [phone] Accepts children up to age 11 who are enrolled in  [place] or [place] Health Choice; pregnant women with a Medicaid card; and children who have applied for Medicaid or [place] Health Choice, but were declined, whose parents can pay a reduced fee at time of service.  [facility] Department of [facility]  [address] [place] [address], [place] [phone] Accepts children up to age 60 who are enrolled in [place] or [place] Health Choice; pregnant women with a Medicaid card; and children who have applied for Medicaid or [place] Health Choice, but were declined, whose parents can pay a reduced fee at time of service.  [facility]  [address] [place], [place] [phone] Patients are seen by appointment only. Walk-ins are not accepted. [facility] will see patients 54 years of age and older. Monday - Tuesday (8am-5pm) Most Wednesdays (8:30-5pm) $30 per visit, cash only  [facility]  [address], [facility] [phone] Patients are seen by appointment only. Walk-ins are not accepted. [facility] will see patients 32 years of age and older. One Wednesday Evening (Monthly: Volunteer Based).  $30 per visit, cash only  [employer]  [phone] for adults; Children under age 72, call Graduate Pediatric Dentistry at [phone]. Children aged 63-14, please call [phone] to request a pediatric application.  Dental services are provided in all areas of dental care including fillings, crowns and bridges, complete and partial dentures, implants, gum treatment, root canals, and extractions. Preventive care is also provided. Treatment is provided to both adults and children. Patients are selected via a lottery and there is often a waiting list.   [facility] [address], [place]  [phone] [email or web address]   [facility] [address] [place], [place] [phone], Ext. [phone] Second and Fourth Thursday of each month, opens at 6:30  AM; Clinic ends at 9 AM.  Patients are seen on a first-come first-served basis, and a limited number are seen during each clinic.   [facility]  [address] Ether Griffins [place], [place] [phone]   Eligibility Requirements You must have lived in [place], [place], or [place] counties for at least the last three months.   You cannot  be eligible for state or federal sponsored [place], including [employer], [place], or [employer].   You generally cannot be eligible for healthcare insurance through your employer.    How to apply: Eligibility screenings are held every Tuesday and Wednesday afternoon from 1:00 pm until 4:00 pm. You do not need an appointment for the interview!  [facility] [address] [place], [place] [phone]   [facility] Health Department  [phone]   [facility] Health Department  [phone]   [facility]  [phone]    Behavioral Health Resources in the Community: Intensive Outpatient Programs Organization         Address  Phone  Notes  [facility] [address], [place], [place] [phone]   [facility] [address] [place], [place] [phone]   [facility] [address], [place], [place]  [phone]   [facility] [address],  [place], [place] [phone] or [phone]   Substance Abuse Resources Organization         Address  Phone  Notes  [facility]  [phone]   [facility]  [phone]   [facility] [place]  [phone]   Floydene Flock  [phone]   Residential & Outpatient Substance Abuse Program  [phone]   Psychological Services Organization         Address  Phone  Notes  [facility]  [phone]   [facility]  [phone]   [facility] [address] [place] [phone] or  [phone]    Mobile Crisis Teams Organization         Address  Phone  Notes  [facility], Mobile Crisis Care Unit  [phone]   [facility]  [address] [place], [place] [phone]   Doristine Locks [address] [place] [phone]    Self-Help/Support Groups Organization         Address  Phone             Notes  [facility] [place] - variety of support groups  [phone]- I7437963 Call for more information  Narcotics Anonymous (NA), [facility] [address], [place]  2 meetings at this location   Statistician         Address  Phone  Notes  [facility] [address],    [place]  [phone]   [facility]  [address] [place] [address], [place], [place] [phone]   [facility] [address] [place], [place] [phone] Admissions: 8am-3pm M-F  [facility] [address]    [place], [place] [phone]   [facility] [address] [place], [place], [place] [phone]   [facility] [address],  [place], [place] [phone]   [facility] - Intensive Outpatient [address]urell Joseph[address], [place], [place] [phone]   [facility] ([facility] [address] [place].,  [place], [place] [phone] or [phone]   [facility] ([facility]) [address] [place], [place] [phone] Accepts Medicaid  [facility] [address],  [place] [phone] Substance Abuse/Addiction Treatment   [facility] Organization         Address  Phone  Notes  [facility]  [phone]   Angie Fava, PhD [phone]  [place], [place]   [phone](336) [phone] or [phone](336) [phone]   [facility]   [address] [place], [place] [phone]) [phone]   [facility] [address],  [place], [place] [phone](336) [phone] Insurance/Medicaid/sponsorship through [facility] [address]                                    [place], [place] [phone](336) [phone] Therapy/tele-psych/case  [facility] [address]   [place], [place] [phone](336) [phone]    Dr. Lolly MustacheArfeen  [phone](336) [phone]   [facility]  [employer] [facility] 1) [address], [place] 2) [address], [place] 3)  [address], [place] [phone](336) [phone](336) [phone]  [phone](336) [phone]   [facility] Child Abuse Hotline [phone](336) [phone] or 8454053112(336) [phone] (After Hours)

## 2015-05-11 NOTE — ED Notes (Signed)
Patient transported to CT 

## 2015-05-11 NOTE — ED Notes (Signed)
Discharge instructions, follow up care, and rx x3 reviewed with patient. Patient verbalized understanding. 

## 2015-05-11 NOTE — ED Notes (Signed)
Pt was not able to give a urine sample at this time.

## 2015-05-11 NOTE — ED Provider Notes (Signed)
CSN: [id number]     Arrival date & time 05/10/15  2347 History   First MD Initiated Contact with Patient 05/11/15 0125     Chief Complaint  Patient presents with  . Abdominal Pain  . Emesis  . Diarrhea     (Consider location/radiation/quality/duration/timing/severity/associated sxs/prior Treatment) HPI   Blood pressure 145/81, pulse 111, temperature 98.3 F (36.8 C), temperature source Oral, resp. rate 18, height  (1.778 m), weight 74.844 kg, SpO2 97 %.  Alan Lamb is a 24 y.o. male complaining of 10 out of 10 diffuse abdominal pain with associated nonbloody, nonbilious, non-coffee ground emesis and looser than normal stool. No sick contacts, fever, chills, change in urination. Patient states he cannot keep down any pain medication. No prior abdominal surgeries. Patient denies testicular pain swelling or urethral discharge.  Past Medical History  Diagnosis Date  . Hypertension    History reviewed. No pertinent past surgical history. Family History  Problem Relation Age of Onset  . Hypertension Other    Social History  Substance Use Topics  . Smoking status: Current Some Day Smoker -- 0.50 packs/day    Types: Cigarettes  . Smokeless tobacco: Never Used  . Alcohol Use: No    Review of Systems  10 systems reviewed and found to be negative, except as noted in the HPI.   Allergies  Other  Home Medications   Prior to Admission medications   Medication Sig Start Date End Date Taking? Authorizing Provider  amoxicillin (AMOXIL) 500 MG capsule Take 1 capsule (500 mg total) by mouth 3 (three) times daily. Patient not taking: Reported on 05/11/2015 09/29/14   Charm Rings, MD  HYDROcodone-acetaminophen (NORCO/VICODIN) 5-325 MG per tablet Take 1-2 tablets by mouth every 4 (four) hours as needed for moderate pain or severe pain. Patient not taking: Reported on 05/11/2015 09/29/14   Charm Rings, MD  ibuprofen (ADVIL,MOTRIN) 600 MG tablet Take 1 tablet (600 mg total)  by mouth every 6 (six) hours as needed. Patient not taking: Reported on 05/11/2015 09/29/14   Charm Rings, MD   BP 145/81 mmHg  Pulse 111  Temp(Src) 98.3 F (36.8 C) (Oral)  Resp 18  Ht  (1.778 m)  Wt 74.844 kg  BMI 23.68 kg/m2  SpO2 97% Physical Exam  Constitutional: He is oriented to person, place, and time. He appears well-developed and well-nourished. No distress.  HENT:  Head: Normocephalic and atraumatic.  Mouth/Throat: Oropharynx is clear and moist.  Eyes: Conjunctivae and EOM are normal. Pupils are equal, round, and reactive to light.  Cardiovascular: Normal rate, regular rhythm and intact distal pulses.   Pulmonary/Chest: Effort normal and breath sounds normal. No stridor.  Abdominal: Soft. Bowel sounds are normal. He exhibits no distension and no mass. There is tenderness. There is no rebound and no guarding.  Mild, diffuse tenderness to palpation with no guarding or rebound.  Murphy sign negative, Mild tenderness to palpation over McBurney's point; Rovsing equivocal; Psoas and obturator  negative.   Musculoskeletal: Normal range of motion.  Neurological: He is alert and oriented to person, place, and time.  Psychiatric: He has a normal mood and affect.  Nursing note and vitals reviewed.   ED Course  Procedures (including critical care time) Labs Review Labs Reviewed  COMPREHENSIVE METABOLIC PANEL - Abnormal; Notable for the following:    Chloride 98 (*)    Glucose, Bld 107 (*)    All other components within normal limits  CBC - Abnormal; Notable for  the following:    WBC 15.4 (*)    Hemoglobin 17.5 (*)    All other components within normal limits  LIPASE, BLOOD  URINALYSIS, ROUTINE W REFLEX MICROSCOPIC (NOT AT [facility])    Imaging Review No results found. I have personally reviewed and evaluated these images and lab results as part of my medical decision-making.   EKG Interpretation None      MDM   Final diagnoses:  Nausea vomiting and diarrhea   Generalized abdominal pain    Filed Vitals:   05/11/15 0400 05/11/15 0414 05/11/15 0431 05/11/15 0438  BP: 113/61  135/73   Pulse: 77 70 101 86  Temp:      TempSrc:      Resp: 16  16   Height:      Weight:      SpO2: 96% 97% 96% 95%    Medications  sodium chloride 0.9 % bolus 1,000 mL (not administered)  morphine 4 MG/ML injection 4 mg (not administered)  ondansetron (ZOFRAN) injection 4 mg (not administered)  famotidine (PEPCID) IVPB 20 mg premix (not administered)    [name] is 24 y.o. male presenting with 10 out of 10 diffuse abdominal pain, nausea vomiting diarrhea. Patient is tachycardic. Abdominal exam is benign with no true focal tenderness, given this patient's white count and severe pain will CT.  Repeat abdominal exam is benign, tachycardia has resolved with fluids. CT shows enteritis. Patient is tolerating by mouth's, reports improvement in symptoms.  Evaluation does not show pathology that would require ongoing emergent intervention or inpatient treatment. Pt is hemodynamically stable and mentating appropriately. Discussed findings and plan with patient/guardian, who agrees with care plan. All questions answered. Return precautions discussed and outpatient follow up given.   Discharge Medication List as of 05/11/2015  5:15 AM    START taking these medications   Details  dicyclomine (BENTYL) 20 MG tablet Take 1 tablet (20 mg total) by mouth 2 (two) times daily., Starting 05/11/2015, Until Discontinued, Print    famotidine (PEPCID) 20 MG tablet Take 1 tablet (20 mg total) by mouth 2 (two) times daily., Starting 05/11/2015, Until Discontinued, Print    ondansetron (ZOFRAN) 4 MG tablet Take 1 tablet (4 mg total) by mouth every 8 (eight) hours as needed for nausea or vomiting., Starting 05/11/2015, Until Discontinued, Print             Alan Emeryicole Raliyah Montella, PA-C 05/11/15 0618  Alan Palumbo, MD 05/11/15 [phone]

## 2015-05-11 NOTE — ED Notes (Signed)
Pt is c/o abd pain that started about 10am  Pt states he has had nausea, vomiting, and diarrhea  Last vomited around 1700

## 2015-10-24 ENCOUNTER — Emergency Department (HOSPITAL_COMMUNITY): Payer: No Typology Code available for payment source

## 2015-10-24 ENCOUNTER — Emergency Department (HOSPITAL_COMMUNITY)
Admission: EM | Admit: 2015-10-24 | Discharge: 2015-10-25 | Disposition: A | Discharge status: Home / Self Care | Payer: No Typology Code available for payment source | Attending: Emergency Medicine | Admitting: Emergency Medicine

## 2015-10-24 ENCOUNTER — Encounter (HOSPITAL_COMMUNITY): Payer: Self-pay | Admitting: Emergency Medicine

## 2015-10-24 DIAGNOSIS — F17210 Nicotine dependence, cigarettes, uncomplicated: Secondary | ICD-10-CM | POA: Insufficient documentation

## 2015-10-24 DIAGNOSIS — Z791 Long term (current) use of non-steroidal anti-inflammatories (NSAID): Secondary | ICD-10-CM | POA: Diagnosis not present

## 2015-10-24 DIAGNOSIS — M25511 Pain in right shoulder: Secondary | ICD-10-CM | POA: Diagnosis not present

## 2015-10-24 DIAGNOSIS — Z79899 Other long term (current) drug therapy: Secondary | ICD-10-CM | POA: Insufficient documentation

## 2015-10-24 DIAGNOSIS — Y999 Unspecified external cause status: Secondary | ICD-10-CM | POA: Insufficient documentation

## 2015-10-24 DIAGNOSIS — Y92410 Unspecified street and highway as the place of occurrence of the external cause: Secondary | ICD-10-CM | POA: Insufficient documentation

## 2015-10-24 DIAGNOSIS — M546 Pain in thoracic spine: Secondary | ICD-10-CM | POA: Insufficient documentation

## 2015-10-24 DIAGNOSIS — Y939 Activity, unspecified: Secondary | ICD-10-CM | POA: Insufficient documentation

## 2015-10-24 DIAGNOSIS — I10 Essential (primary) hypertension: Secondary | ICD-10-CM | POA: Diagnosis not present
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
## 2015-10-25 MED ORDER — IBUPROFEN 800 MG PO TABS
800.0000 mg | ORAL_TABLET | Freq: Three times a day (TID) | ORAL | Status: DC
Start: 2015-10-25 — End: 2018-06-01

## 2015-10-25 MED ORDER — IBUPROFEN 400 MG PO TABS
800.0000 mg | ORAL_TABLET | Freq: Once | ORAL | Status: AC
  Administered 2015-10-25: 800 mg via ORAL
  Filled 2015-10-25: qty 2

## 2015-10-25 MED ORDER — METHOCARBAMOL 500 MG PO TABS: 500.0000 mg | ORAL_TABLET | Freq: Two times a day (BID) | ORAL | Status: DC
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
## 2016-09-27 ENCOUNTER — Encounter (HOSPITAL_COMMUNITY): Payer: Self-pay | Admitting: Emergency Medicine

## 2016-09-27 ENCOUNTER — Emergency Department (HOSPITAL_COMMUNITY)
Admission: EM | Admit: 2016-09-27 | Discharge: 2016-09-27 | Disposition: A | Discharge status: Home / Self Care | Payer: Self-pay | Attending: Emergency Medicine | Admitting: Emergency Medicine

## 2016-09-27 DIAGNOSIS — K029 Dental caries, unspecified: Secondary | ICD-10-CM | POA: Insufficient documentation

## 2016-09-27 DIAGNOSIS — I10 Essential (primary) hypertension: Secondary | ICD-10-CM | POA: Insufficient documentation

## 2016-09-27 DIAGNOSIS — F17210 Nicotine dependence, cigarettes, uncomplicated: Secondary | ICD-10-CM | POA: Insufficient documentation

## 2016-09-27 DIAGNOSIS — K0889 Other specified disorders of teeth and supporting structures: Secondary | ICD-10-CM

## 2016-09-27 DIAGNOSIS — Z79899 Other long term (current) drug therapy: Secondary | ICD-10-CM | POA: Insufficient documentation

## 2016-09-27 MED ORDER — KETOROLAC TROMETHAMINE 60 MG/2ML IM SOLN
60.0000 mg | Freq: Once | INTRAMUSCULAR | Status: AC
  Administered 2016-09-27: 60 mg via INTRAMUSCULAR
  Filled 2016-09-27: qty 2

## 2016-09-27 MED ORDER — NAPROXEN 500 MG PO TABS: 500.0000 mg | ORAL_TABLET | Freq: Two times a day (BID) | ORAL | 0 refills | Status: DC

## 2016-09-27 MED ORDER — PENICILLIN V POTASSIUM 500 MG PO TABS: 1000.0000 mg | ORAL_TABLET | Freq: Two times a day (BID) | ORAL | 0 refills | Status: DC

## 2016-09-27 NOTE — Discharge Instructions (Signed)
1. Medications: naprosyn, penicillin, usual home medications °2. Treatment: rest, drink plenty of fluids, take medications as prescribed °3. Follow Up: Please followup with dentistry within 1 week for discussion of your diagnoses and further evaluation after today's visit; if you do not have a primary care doctor use the resource guide provided to find one; Return to the ER for high fevers, difficulty breathing, difficulty swallowing or other concerning symptoms ° °

## 2016-09-27 NOTE — ED Notes (Signed)
Patient is alert and oriented x3.  He was given DC instructions and follow up visit instructions.  Patient gave verbal understanding.  He was DC ambulatory under his own power to home.  V/S stable.  He was not showing any signs of distress on DC 

## 2016-09-27 NOTE — ED Provider Notes (Signed)
[facility]-EMERGENCY DEPT Provider Note   CSN: [id number] Arrival date & time: 09/27/16  [id number]     History   Chief Complaint Chief Complaint  Patient presents with  . Dental Pain    HPI Alan Lamb is a 26 y.o. male with a hx of Hypertension, currently unmedicated, presents to the Emergency Department complaining of gradual, persistent, progressively worsening left upper dental pain onset 2 days ago.  Patient denies fevers or chills, difficulty swallowing, neck pain, headaches, vision changes, year pain, face swelling. He reports eating and drinking makes the symptoms worse. He has not attempted any treatments prior to arrival. He has not seen a dentist in many years.   The history is provided by the patient and medical records. No language interpreter was used.    Past Medical History:  Diagnosis Date  . Hypertension     There are no active problems to display for this patient.   History reviewed. No pertinent surgical history.     Home Medications    Prior to Admission medications   Medication Sig Start Date End Date Taking? Authorizing Provider  amoxicillin (AMOXIL) 500 MG capsule Take 1 capsule (500 mg total) by mouth 3 (three) times daily. Patient not taking: Reported on 05/11/2015 09/29/14   Charm RingsHonig, Erin J, MD  dicyclomine (BENTYL) 20 MG tablet Take 1 tablet (20 mg total) by mouth 2 (two) times daily. 05/11/15   Pisciotta, Joni ReiningNicole, PA-C  famotidine (PEPCID) 20 MG tablet Take 1 tablet (20 mg total) by mouth 2 (two) times daily. 05/11/15   Pisciotta, Joni ReiningNicole, PA-C  HYDROcodone-acetaminophen (NORCO/VICODIN) 5-325 MG per tablet Take 1-2 tablets by mouth every 4 (four) hours as needed for moderate pain or severe pain. Patient not taking: Reported on 05/11/2015 09/29/14   Charm RingsHonig, Erin J, MD  ibuprofen (ADVIL,MOTRIN) 600 MG tablet Take 1 tablet (600 mg total) by mouth every 6 (six) hours as needed. Patient not taking: Reported on 05/11/2015 09/29/14   Charm RingsHonig, Erin J, MD    ibuprofen (ADVIL,MOTRIN) 800 MG tablet Take 1 tablet (800 mg total) by mouth 3 (three) times daily. 10/25/15   Barrett, Rolm GalaStevi, PA-C  methocarbamol (ROBAXIN) 500 MG tablet Take 1 tablet (500 mg total) by mouth 2 (two) times daily. 10/25/15   Barrett, Rolm GalaStevi, PA-C  naproxen (NAPROSYN) 500 MG tablet Take 1 tablet (500 mg total) by mouth 2 (two) times daily with a meal. 09/27/16   Ivanna Kocak, Dahlia ClientHannah, PA-C  ondansetron (ZOFRAN) 4 MG tablet Take 1 tablet (4 mg total) by mouth every 8 (eight) hours as needed for nausea or vomiting. 05/11/15   Pisciotta, Joni ReiningNicole, PA-C  penicillin v potassium (VEETID) 500 MG tablet Take 2 tablets (1,000 mg total) by mouth 2 (two) times daily. X 7 days 09/27/16   Piera Downs, Dahlia ClientHannah, PA-C    Family History Family History  Problem Relation Age of Onset  . Hypertension Other     Social History Social History  Substance Use Topics  . Smoking status: Current Some Day Smoker    Packs/day: 0.50    Types: Cigarettes  . Smokeless tobacco: Never Used  . Alcohol use No     Allergies   Other   Review of Systems Review of Systems  Constitutional: Negative for appetite change, chills and fever.  HENT: Positive for dental problem. Negative for drooling, ear pain, facial swelling, nosebleeds, postnasal drip, rhinorrhea and trouble swallowing.   Eyes: Negative for pain and redness.  Respiratory: Negative for cough and wheezing.   Cardiovascular: Negative for chest  pain.  Gastrointestinal: Negative for abdominal pain, nausea and vomiting.  Musculoskeletal: Negative for neck pain and neck stiffness.  Skin: Negative for color change and rash.  Neurological: Negative for weakness, light-headedness and headaches.  All other systems reviewed and are negative.    Physical Exam Updated Vital Signs BP (!) 147/91 (BP Location: Left Arm)   Pulse (!) 58   Temp 97.9 F (36.6 C) (Oral)   Resp 16   Ht 5\' 10"  (1.778 m)   Wt 165 lb (74.8 kg)   SpO2 99%   BMI 23.68 kg/m    Physical Exam  Constitutional: He appears well-developed and well-nourished.  HENT:  Head: Normocephalic.  Right Ear: Tympanic membrane, external ear and ear canal normal.  Left Ear: Tympanic membrane, external ear and ear canal normal.  Nose: Nose normal. Right sinus exhibits no maxillary sinus tenderness and no frontal sinus tenderness. Left sinus exhibits no maxillary sinus tenderness and no frontal sinus tenderness.  Mouth/Throat: Uvula is midline, oropharynx is clear and moist and mucous membranes are normal. No oral lesions. Abnormal dentition. Dental caries present. No uvula swelling or lacerations. No oropharyngeal exudate, posterior oropharyngeal edema, posterior oropharyngeal erythema or tonsillar abscesses.  Left upper premolar with large dental carrie No gross abscess No fluctuance or induration to the buccal mucosa or floor of the mouth  Eyes: Conjunctivae are normal. Pupils are equal, round, and reactive to light. Right eye exhibits no discharge. Left eye exhibits no discharge.  Neck: Normal range of motion. Neck supple.  No stridor Handling secretions without difficulty No nuchal rigidity No cervical lymphadenopathy  Cardiovascular: Normal rate, regular rhythm and normal heart sounds.   Pulmonary/Chest: Effort normal. No respiratory distress.  Equal chest rise  Abdominal: Soft. Bowel sounds are normal. He exhibits no distension. There is no tenderness.  Lymphadenopathy:    He has no cervical adenopathy.  Neurological: He is alert.  Skin: Skin is warm and dry.  Psychiatric: He has a normal mood and affect.  Nursing note and vitals reviewed.    ED Treatments / Results   Procedures Procedures (including critical care time)  Medications Ordered in ED Medications  ketorolac (TORADOL) injection 60 mg (not administered)     Initial Impression / Assessment and Plan / ED Course  I have reviewed the triage vital signs and the nursing notes.  Pertinent labs &  imaging results that were available during my care of the patient were reviewed by me and considered in my medical decision making (see chart for details).     Patient with toothache.  No gross abscess.  Exam unconcerning for Ludwig's angina or spread of infection.  Will treat with penicillin and anti-inflammatories medicine.  Urged patient to follow-up with dentist.    Patient noted to be hypertensive in the emergency department.  No signs of hypertensive urgency.  Discussed with patient the need for close follow-up and management by their primary care physician.    Final Clinical Impressions(s) / ED Diagnoses   Final diagnoses:  Pain, dental  Dental caries    New Prescriptions New Prescriptions   NAPROXEN (NAPROSYN) 500 MG TABLET    Take 1 tablet (500 mg total) by mouth 2 (two) times daily with a meal.   PENICILLIN V POTASSIUM (VEETID) 500 MG TABLET    Take 2 tablets (1,000 mg total) by mouth 2 (two) times daily. X 7 days     Milta Deiters 09/27/16 1610    Palumbo, April, MD 09/27/16 [phone]

## 2016-09-27 NOTE — ED Triage Notes (Signed)
Pt states he has a toothache on the left top

## 2017-10-15 ENCOUNTER — Emergency Department (HOSPITAL_COMMUNITY)
Admission: EM | Admit: 2017-10-15 | Discharge: 2017-10-15 | Disposition: A | Discharge status: Home / Self Care | Payer: Self-pay | Attending: Emergency Medicine | Admitting: Emergency Medicine

## 2017-10-15 ENCOUNTER — Encounter (HOSPITAL_COMMUNITY): Payer: Self-pay | Admitting: Emergency Medicine

## 2017-10-15 ENCOUNTER — Other Ambulatory Visit: Payer: Self-pay

## 2017-10-15 DIAGNOSIS — Z79899 Other long term (current) drug therapy: Secondary | ICD-10-CM | POA: Insufficient documentation

## 2017-10-15 DIAGNOSIS — F17210 Nicotine dependence, cigarettes, uncomplicated: Secondary | ICD-10-CM | POA: Insufficient documentation

## 2017-10-15 DIAGNOSIS — N4889 Other specified disorders of penis: Secondary | ICD-10-CM | POA: Insufficient documentation

## 2017-10-15 DIAGNOSIS — I10 Essential (primary) hypertension: Secondary | ICD-10-CM | POA: Insufficient documentation

## 2017-10-15 MED ORDER — PREDNISONE 10 MG PO TABS: 20.0000 mg | ORAL_TABLET | Freq: Two times a day (BID) | ORAL | 0 refills | Status: DC

## 2017-10-15 MED ORDER — PREDNISONE 20 MG PO TABS
60.0000 mg | ORAL_TABLET | Freq: Once | ORAL | Status: AC
Start: 2017-10-15 — End: 2017-10-15
  Administered 2017-10-15: 60 mg via ORAL
  Filled 2017-10-15: qty 3

## 2017-10-15 MED ORDER — DIPHENHYDRAMINE HCL 25 MG PO TABS: 25.0000 mg | ORAL_TABLET | Freq: Every evening | ORAL | 0 refills | Status: AC | PRN

## 2017-10-15 MED ORDER — RANITIDINE HCL 150 MG PO TABS: 150.0000 mg | ORAL_TABLET | Freq: Two times a day (BID) | ORAL | 0 refills | Status: AC

## 2017-10-15 MED ORDER — DIPHENHYDRAMINE HCL 25 MG PO CAPS
25.0000 mg | ORAL_CAPSULE | Freq: Once | ORAL | Status: AC
  Administered 2017-10-15: 25 mg via ORAL
  Filled 2017-10-15: qty 1

## 2017-10-15 MED ORDER — FAMOTIDINE 20 MG PO TABS
20.0000 mg | ORAL_TABLET | Freq: Once | ORAL | Status: AC
  Administered 2017-10-15: 20 mg via ORAL
  Filled 2017-10-15: qty 1

## 2017-10-15 NOTE — ED Triage Notes (Signed)
Patient here from home with complaints of right side penile swelling that started this morning when waking. Denis trouble urinating. No hx.

## 2017-10-15 NOTE — ED Provider Notes (Signed)
[facility]-EMERGENCY DEPT Provider Note   CSN: [id number] Arrival date & time: 10/15/17  1240     History   Chief Complaint Chief Complaint  Patient presents with  . Groin Swelling    HPI Alan Lamb is a 27 y.o. male who presents to the ED with swelling near the head of his penis. The symptoms started this morning. The swelling is limited to an area on  the right side of his penis. Last sexual intercourse 5 days ago but did have oral sex a couple nights ago. No injury to the penis. Patient denies dysuria, discharge and has no concern for STD.  HPI  Past Medical History:  Diagnosis Date  . Hypertension     There are no active problems to display for this patient.   History reviewed. No pertinent surgical history.      Home Medications    Prior to Admission medications   Medication Sig Start Date End Date Taking? Authorizing Provider  amoxicillin (AMOXIL) 500 MG capsule Take 1 capsule (500 mg total) by mouth 3 (three) times daily. Patient not taking: Reported on 05/11/2015 09/29/14   Charm Rings, MD  dicyclomine (BENTYL) 20 MG tablet Take 1 tablet (20 mg total) by mouth 2 (two) times daily. 05/11/15   Pisciotta, Joni Reining, PA-C  diphenhydrAMINE (BENADRYL) 25 MG tablet Take 1 tablet (25 mg total) by mouth at bedtime as needed. 10/15/17   Janne Napoleon, NP  famotidine (PEPCID) 20 MG tablet Take 1 tablet (20 mg total) by mouth 2 (two) times daily. 05/11/15   Pisciotta, Joni Reining, PA-C  HYDROcodone-acetaminophen (NORCO/VICODIN) 5-325 MG per tablet Take 1-2 tablets by mouth every 4 (four) hours as needed for moderate pain or severe pain. Patient not taking: Reported on 05/11/2015 09/29/14   Charm Rings, MD  ibuprofen (ADVIL,MOTRIN) 600 MG tablet Take 1 tablet (600 mg total) by mouth every 6 (six) hours as needed. Patient not taking: Reported on 05/11/2015 09/29/14   Charm Rings, MD  ibuprofen (ADVIL,MOTRIN) 800 MG tablet Take 1 tablet (800 mg total) by  mouth 3 (three) times daily. 10/25/15   Barrett, Rolm Gala, PA-C  methocarbamol (ROBAXIN) 500 MG tablet Take 1 tablet (500 mg total) by mouth 2 (two) times daily. 10/25/15   Barrett, Rolm Gala, PA-C  naproxen (NAPROSYN) 500 MG tablet Take 1 tablet (500 mg total) by mouth 2 (two) times daily with a meal. 09/27/16   Muthersbaugh, Dahlia Client, PA-C  ondansetron (ZOFRAN) 4 MG tablet Take 1 tablet (4 mg total) by mouth every 8 (eight) hours as needed for nausea or vomiting. 05/11/15   Pisciotta, Joni Reining, PA-C  penicillin v potassium (VEETID) 500 MG tablet Take 2 tablets (1,000 mg total) by mouth 2 (two) times daily. X 7 days 09/27/16   Muthersbaugh, Dahlia Client, PA-C  predniSONE (DELTASONE) 10 MG tablet Take 2 tablets (20 mg total) by mouth 2 (two) times daily with a meal. 10/15/17   Damian Leavell, [place], NP  ranitidine (ZANTAC) 150 MG tablet Take 1 tablet (150 mg total) by mouth 2 (two) times daily. 10/15/17   Janne Napoleon, NP    Family History Family History  Problem Relation Age of Onset  . Hypertension Other     Social History Social History   Tobacco Use  . Smoking status: Current Some Day Smoker    Packs/day: 0.50    Types: Cigarettes  . Smokeless tobacco: Never Used  Substance Use Topics  . Alcohol use: No    Alcohol/week: 0.0  oz  . Drug use: No    Frequency: 14.0 times per week     Allergies   Other   Review of Systems Review of Systems  Skin:       Swelling of penis  All other systems reviewed and are negative.    Physical Exam Updated Vital Signs BP 138/87 (BP Location: Right Arm)   Pulse 69   Temp 98 F (36.7 C) (Oral)   Resp 20   SpO2 99%   Physical Exam  Constitutional: He appears well-developed and well-nourished. No distress.  HENT:  Head: Normocephalic.  Eyes: EOM are normal.  Neck: Neck supple.  Cardiovascular: Normal rate.  Pulmonary/Chest: Effort normal.  Genitourinary: Testes normal. Circumcised. No discharge found.     Genitourinary Comments: Area of swelling to the  head of the penis on the right side with tiny dark area that appears as possible insect bite.   Musculoskeletal: Normal range of motion.  Lymphadenopathy: Inguinal adenopathy noted on the right and left side.  Neurological: He is alert.  Skin: Skin is warm and dry.  Psychiatric: He has a normal mood and affect.  Nursing note and vitals reviewed.    ED Treatments / Results  Labs (all labs ordered are listed, but only abnormal results are displayed) Labs Reviewed - No data to display  Radiology No results found.  Procedures Procedures (including critical care time)  Medications Ordered in ED Medications  diphenhydrAMINE (BENADRYL) capsule 25 mg (25 mg Oral Given 10/15/17 1451)  famotidine (PEPCID) tablet 20 mg (20 mg Oral Given 10/15/17 1451)  predniSONE (DELTASONE) tablet 60 mg (60 mg Oral Given 10/15/17 1451)     Initial Impression / Assessment and Plan / ED Course  I have reviewed the triage vital signs and the nursing notes. 27 y.o. male with area of swelling to the penis stable for d/c with improvement after benadryl and prednisone. Patient will continue medication and f/u with his PCP. He will return here as needed for worsening symptoms.   Final Clinical Impressions(s) / ED Diagnoses   Final diagnoses:  Penile swelling    ED Discharge Orders        Ordered    ranitidine (ZANTAC) 150 MG tablet  2 times daily     10/15/17 1548    predniSONE (DELTASONE) 10 MG tablet  2 times daily with meals     10/15/17 1548    diphenhydrAMINE (BENADRYL) 25 MG tablet  At bedtime PRN     10/15/17 1548       Damian Leavelleese, [place] M, NP 10/15/17 1937    Tegeler, Canary Brimhristopher J, MD 10/15/17 2018

## 2018-03-01 ENCOUNTER — Encounter (HOSPITAL_COMMUNITY): Payer: Self-pay | Admitting: Emergency Medicine

## 2018-03-01 ENCOUNTER — Other Ambulatory Visit: Payer: Self-pay

## 2018-03-01 ENCOUNTER — Emergency Department (HOSPITAL_COMMUNITY)
Admission: EM | Admit: 2018-03-01 | Discharge: 2018-03-01 | Disposition: A | Discharge status: Home / Self Care | Payer: Self-pay | Attending: Emergency Medicine | Admitting: Emergency Medicine

## 2018-03-01 ENCOUNTER — Emergency Department (HOSPITAL_COMMUNITY): Payer: Self-pay

## 2018-03-01 DIAGNOSIS — Z79899 Other long term (current) drug therapy: Secondary | ICD-10-CM | POA: Insufficient documentation

## 2018-03-01 DIAGNOSIS — I10 Essential (primary) hypertension: Secondary | ICD-10-CM | POA: Insufficient documentation

## 2018-03-01 DIAGNOSIS — F17210 Nicotine dependence, cigarettes, uncomplicated: Secondary | ICD-10-CM | POA: Insufficient documentation

## 2018-03-01 DIAGNOSIS — R079 Chest pain, unspecified: Secondary | ICD-10-CM | POA: Insufficient documentation

## 2018-03-01 LAB — BASIC METABOLIC PANEL
Anion gap: 7 (ref 5–15)
BUN: 14 mg/dL (ref 6–20)
CHLORIDE: 107 mmol/L (ref 98–111)
CO2: 24 mmol/L (ref 22–32)
CREATININE: 1.11 mg/dL (ref 0.61–1.24)
Calcium: 9.5 mg/dL (ref 8.9–10.3)
GFR calc Af Amer: >60 mL/min (ref >60)
Glucose, Bld: 92 mg/dL (ref 70–99)
POTASSIUM: 4 mmol/L (ref 3.5–5.1)
Sodium: 138 mmol/L (ref 135–145)

## 2018-03-01 LAB — I-STAT TROPONIN, ED: Troponin i, poc: 0.01 ng/mL (ref 0.00–0.08)

## 2018-03-01 LAB — CBC
HCT: 47.7 % (ref 39.0–52.0)
HEMOGLOBIN: 16 g/dL (ref 13.0–17.0)
MCH: 30.8 pg (ref 26.0–34.0)
MCHC: 33.5 g/dL (ref 30.0–36.0)
MCV: 91.9 fL (ref 80.0–100.0)
Platelets: 255 10*3/uL (ref 150–400)
RBC: 5.19 MIL/uL (ref 4.22–5.81)
RDW: 12.8 % (ref 11.5–15.5)
WBC: 10.3 10*3/uL (ref 4.0–10.5)
nRBC: 0 % (ref 0.0–0.2)

## 2018-03-01 LAB — D-DIMER, QUANTITATIVE: D-Dimer, Quant: <0.27 ug/mL-FEU (ref 0.00–0.50)

## 2018-03-01 MED ORDER — LORAZEPAM 0.5 MG PO TABS
0.5000 mg | ORAL_TABLET | Freq: Once | ORAL | Status: DC
  Filled 2018-03-01: qty 1

## 2018-03-01 MED ORDER — MORPHINE SULFATE (PF) 4 MG/ML IV SOLN
4.0000 mg | Freq: Once | INTRAVENOUS | Status: AC
  Administered 2018-03-01: 4 mg via INTRAVENOUS
  Filled 2018-03-01: qty 1

## 2018-03-01 NOTE — Discharge Instructions (Signed)
As we discussed, please follow-up with the [facility] to establish a primary care doctor.  You can take Tylenol or Ibuprofen as directed for pain. You can alternate Tylenol and Ibuprofen every 4 hours. If you take Tylenol at 1pm, then you can take Ibuprofen at 5pm. Then you can take Tylenol again at 9pm.   Return to the Emergency Department immediately if you experiencing worsening chest pain, difficulty breathing, nausea/vomiting, get very sweaty, headache or any other worsening or concerning symptoms.

## 2018-03-01 NOTE — ED Triage Notes (Signed)
C/o substernal chest pain x 2 weeks that has been worse over the past 2 days.  Also reports sob and dizziness.  States he passed out for 3-4 minutes yesterday.  Denies nausea and vomiting.

## 2018-03-01 NOTE — ED Provider Notes (Signed)
[facility] EMERGENCY DEPARTMENT Provider Note   CSN: [id number] Arrival date & time: 03/01/18  1739     History   Chief Complaint Chief Complaint  Patient presents with  . Chest Pain    HPI Alan Lamb is a 27 y.o. male with PMH/o HTN who presents for evaluation of 2 weeks of chest pain, shortness of breath.  Patient states that initially symptoms were intermittent.  He states that he would get some sharp chest pain in the middle of his chest and then have difficulty breathing.  Pain is worse with deep inspiration and palpation of midsternal area.  Patient states there is no triggering event that would bring the chest pain and that it would eventually resolve on its own.  He states it was not brought on by any exertional activity.  He states that over the last 2 days, the chest pain has been constant.  Additionally, he reports that he has had intermittent episodes of shortness of breath.  He states that yesterday, he was in his house and became very lightheaded and had a near syncopal episode.  He is unsure of if he had positive LOC.  He states that he remembered his wife talking to him she states that he was minimally responsive.  This lasted for about 3 to 4 minutes.  He was not having any exertional activity during this episode.  Patient denies any nausea/vomiting.  Patient reports he smokes about 4 cigarettes a day.  He denies any cocaine or IV drug use.  Patient reports that he has no personal cardiac history denies any family cardiac history.  He does have a history of hypertension but does not have any diabetes.  Patient does report that he has been very stressed out lately.  He does state that when he started thinking about the thoughts that were stressing him out, it would cause his pain to occur.  Patient denies any fever, congestion, cough, leg swelling, abdominal pain, nausea/vomiting, numbness/weakness of his arms or legs, vision changes. He denies any  exogenous hormone use, recent immobilization, prior history of DVT/PE, recent surgery, leg swelling, or long travel.   The history is provided by the patient.    Past Medical History:  Diagnosis Date  . Hypertension     There are no active problems to display for this patient.   History reviewed. No pertinent surgical history.      Home Medications    Prior to Admission medications   Medication Sig Start Date End Date Taking? Authorizing Provider  amoxicillin (AMOXIL) 500 MG capsule Take 1 capsule (500 mg total) by mouth 3 (three) times daily. Patient not taking: Reported on 05/11/2015 09/29/14   Charm Rings, MD  dicyclomine (BENTYL) 20 MG tablet Take 1 tablet (20 mg total) by mouth 2 (two) times daily. 05/11/15   Pisciotta, Joni Reining, PA-C  diphenhydrAMINE (BENADRYL) 25 MG tablet Take 1 tablet (25 mg total) by mouth at bedtime as needed. 10/15/17   Janne Napoleon, NP  famotidine (PEPCID) 20 MG tablet Take 1 tablet (20 mg total) by mouth 2 (two) times daily. 05/11/15   Pisciotta, Joni Reining, PA-C  HYDROcodone-acetaminophen (NORCO/VICODIN) 5-325 MG per tablet Take 1-2 tablets by mouth every 4 (four) hours as needed for moderate pain or severe pain. Patient not taking: Reported on 05/11/2015 09/29/14   Charm Rings, MD  ibuprofen (ADVIL,MOTRIN) 600 MG tablet Take 1 tablet (600 mg total) by mouth every 6 (six) hours as needed. Patient not  taking: Reported on 05/11/2015 09/29/14   Charm Rings, MD  ibuprofen (ADVIL,MOTRIN) 800 MG tablet Take 1 tablet (800 mg total) by mouth 3 (three) times daily. 10/25/15   Barrett, Rolm Gala, PA-C  methocarbamol (ROBAXIN) 500 MG tablet Take 1 tablet (500 mg total) by mouth 2 (two) times daily. 10/25/15   Barrett, Rolm Gala, PA-C  naproxen (NAPROSYN) 500 MG tablet Take 1 tablet (500 mg total) by mouth 2 (two) times daily with a meal. 09/27/16   Muthersbaugh, Dahlia Client, PA-C  ondansetron (ZOFRAN) 4 MG tablet Take 1 tablet (4 mg total) by mouth every 8 (eight) hours as needed  for nausea or vomiting. 05/11/15   Pisciotta, Joni Reining, PA-C  penicillin v potassium (VEETID) 500 MG tablet Take 2 tablets (1,000 mg total) by mouth 2 (two) times daily. X 7 days 09/27/16   Muthersbaugh, Dahlia Client, PA-C  predniSONE (DELTASONE) 10 MG tablet Take 2 tablets (20 mg total) by mouth 2 (two) times daily with a meal. 10/15/17   Damian Leavell, [place], NP  ranitidine (ZANTAC) 150 MG tablet Take 1 tablet (150 mg total) by mouth 2 (two) times daily. 10/15/17   Janne Napoleon, NP    Family History Family History  Problem Relation Age of Onset  . Hypertension Other     Social History Social History   Tobacco Use  . Smoking status: Current Some Day Smoker    Packs/day: 0.50    Types: Cigarettes  . Smokeless tobacco: Never Used  Substance Use Topics  . Alcohol use: No  . Drug use: No    Frequency: 14.0 times per week     Allergies   Other   Review of Systems Review of Systems  Constitutional: Negative for fever.  Eyes: Negative for visual disturbance.  Respiratory: Positive for shortness of breath. Negative for cough.   Cardiovascular: Positive for chest pain. Negative for leg swelling.  Gastrointestinal: Negative for abdominal pain, nausea and vomiting.  Genitourinary: Negative for dysuria and hematuria.  Neurological: Positive for light-headedness. Negative for weakness, numbness and headaches.  All other systems reviewed and are negative.    Physical Exam Updated Vital Signs BP (!) 132/92 (BP Location: Left Arm)   Pulse (!) 49   Temp 98.3 F (36.8 C) (Oral)   Resp 14   Ht 5\' 10"  (1.778 m)   Wt 70.3 kg   SpO2 100%   BMI 22.24 kg/m   Physical Exam  Constitutional: He is oriented to person, place, and time. He appears well-developed and well-nourished.  Patient is very anxious and is hyperventilating.  HENT:  Head: Normocephalic and atraumatic.  Mouth/Throat: Oropharynx is clear and moist and mucous membranes are normal.  Eyes: Pupils are equal, round, and reactive to  light. Conjunctivae, EOM and lids are normal.  Neck: Full passive range of motion without pain.  Cardiovascular: Normal rate, regular rhythm and normal heart sounds. Exam reveals no gallop and no friction rub.  No murmur heard. Pulses:      Radial pulses are 2+ on the right side, and 2+ on the left side.       Dorsalis pedis pulses are 2+ on the right side, and 2+ on the left side.  Pulmonary/Chest: Effort normal and breath sounds normal. Tachypnea noted.  Lungs clear to auscultation bilaterally.  Symmetric chest rise.  No wheezing, rales, rhonchi.  Tenderness palpation noted to the midsternal area.  No deformity or crepitus noted.  Pain is reproduced with palpation.  Abdominal: Soft. Normal appearance. There is no tenderness. There  is no rigidity and no guarding.  Musculoskeletal: Normal range of motion.  Bilateral lower extremities are symmetric in appearance without any overlying warmth, erythema, edema.  Neurological: He is alert and oriented to person, place, and time.  Skin: Skin is warm and dry. Capillary refill takes less than 2 seconds.  Psychiatric: His speech is normal. His mood appears anxious.  Nursing note and vitals reviewed.    ED Treatments / Results  Labs (all labs ordered are listed, but only abnormal results are displayed) Labs Reviewed  BASIC METABOLIC PANEL  CBC  D-DIMER, QUANTITATIVE (NOT AT [facility])  I-STAT TROPONIN, ED    EKG EKG Interpretation  Date/Time:  Sunday March 01 2018 17:43:05 EDT Ventricular Rate:  65 PR Interval:  130 QRS Duration: 104 QT Interval:  376 QTC Calculation: 391 R Axis:   104 Text Interpretation:  Normal sinus rhythm Incomplete right bundle branch block Possible Right ventricular hypertrophy Abnormal ECG No old tracing to compare Confirmed by Floyd, Dan ([id number]) on 03/01/2018 5:59:25 PM   Radiology Dg Chest 2 View  Result Date: 03/01/2018 CLINICAL DATA:  Chest pain for 2 weeks EXAM: CHEST - 2 VIEW COMPARISON:  02/29/2012  FINDINGS: Normal heart size and mediastinal contours. Artifact from EKG leads. No acute infiltrate or edema. No effusion or pneumothorax. No acute osseous findings. IMPRESSION: Negative chest. Electronically Signed   By: Jonathon  Watts M.D.   On: 03/01/2018 18:31    Procedures Procedures (including critical care time)  Medications Ordered in ED Medications  LORazepam (ATIVAN) tablet 0.5 mg (0.5 mg Oral Not Given 03/01/18 2107)  morphine 4 MG/ML injection 4 mg (4 mg Intravenous Given 03/01/18 1944)     Initial Impression / Assessment and Plan / ED Course  I have reviewed the triage vital signs and the nursing notes.  Pertinent labs & imaging results that were available during my care of the patient were reviewed by me and considered in my medical decision making (see chart for details).     27  year old male who presents for evaluation of chest pain and shortness of breath that is been ongoing for last 2 weeks.  Intermittent at first but then has been constant over the last 2 days.  Not associated with any exertional activity.  Does report that pain is worse with deep inspiration and palpation.  He is a smoker but denies any cocaine, IV drug use.  Initial ED arrival, patient is afebrile.  He is slightly hypertensive.  O2 sats are stable at 100% on room air.  On my initial evaluation, he is hyperventilating and appears very anxious.  Pain is reproduced with palpation and he states pain is worse with deep inspiration.  Patient is PERC negative therefore at low risk for PE but given symptoms and duration, will plan for d-dimer for evaluation.  Low suspicion for ACS etiology given history/physical exam, risk factors and duration of symptoms but a consideration.  Consider muscular skeletal pain versus anxiety versus infectious etiology.  I-STAT troponin negative.  CBC without any significant leukocytosis or anemia.  BMP is normal.  D-dimer is negative.  Discussed results with patient.  He reports  improvement in pain after analgesics here in ED.  He has not had any difficulty breathing.  I discussed in depth with him and his girlfriend about his symptoms.  They do report that he has been significantly stressed over the last few weeks.  Patient does endorse that when he gets very stressed, that makes his pain come back.  I  suspect that there may be an anxiety component to his pain.  He does not have a primary care doctor.  We will give him some Ativan here in ED.  Patient ambulated in the ED without any symptoms.  His O2 sat was 91%.  His O2 sats remained stable here.  Additionally, he is PERC negative with a negative d-dimer.  Do not suspect PE as the cause of patient's symptoms.  Patient symptoms have been ongoing for last 2 weeks.  Additionally, he states that over last 48 hours, his chest pain is been constant.  Given duration of symptoms, one troponin is sufficient for ACS rule out.  We will plan to give outpatient referral to [facility] clinic for primary care establishment. Patient had ample opportunity for questions and discussion. All patient's questions were answered with full understanding. Strict return precautions discussed. Patient expresses understanding and agreement to plan.   Final Clinical Impressions(s) / ED Diagnoses   Final diagnoses:  Nonspecific chest pain    ED Discharge Orders    None       Rosana Hoes 03/01/18 2128    Melene Plan, DO 03/01/18 2346

## 2018-03-01 NOTE — ED Notes (Signed)
Patient verbalizes understanding of medications and discharge instructions. No further questions at this time. VSS and patient ambulatory at discharge.   

## 2018-03-01 NOTE — ED Notes (Signed)
Patients O2 was 91% room air while ambulating. PA Mardella Layman has been notified.

## 2018-06-01 ENCOUNTER — Other Ambulatory Visit: Payer: Self-pay

## 2018-06-01 ENCOUNTER — Emergency Department (HOSPITAL_COMMUNITY): Payer: Self-pay

## 2018-06-01 ENCOUNTER — Emergency Department (HOSPITAL_COMMUNITY)
Admission: EM | Admit: 2018-06-01 | Discharge: 2018-06-01 | Disposition: A | Discharge status: Home / Self Care | Payer: Self-pay | Attending: Emergency Medicine | Admitting: Emergency Medicine

## 2018-06-01 ENCOUNTER — Encounter (HOSPITAL_COMMUNITY): Payer: Self-pay | Admitting: Emergency Medicine

## 2018-06-01 DIAGNOSIS — R079 Chest pain, unspecified: Secondary | ICD-10-CM

## 2018-06-01 DIAGNOSIS — I10 Essential (primary) hypertension: Secondary | ICD-10-CM | POA: Insufficient documentation

## 2018-06-01 DIAGNOSIS — Z79899 Other long term (current) drug therapy: Secondary | ICD-10-CM | POA: Insufficient documentation

## 2018-06-01 DIAGNOSIS — F17210 Nicotine dependence, cigarettes, uncomplicated: Secondary | ICD-10-CM | POA: Insufficient documentation

## 2018-06-01 LAB — CBC
HEMATOCRIT: 45.2 % (ref 39.0–52.0)
Hemoglobin: 15 g/dL (ref 13.0–17.0)
MCH: 30.3 pg (ref 26.0–34.0)
MCHC: 33.2 g/dL (ref 30.0–36.0)
MCV: 91.3 fL (ref 80.0–100.0)
NRBC: 0 % (ref 0.0–0.2)
PLATELETS: 261 10*3/uL (ref 150–400)
RBC: 4.95 MIL/uL (ref 4.22–5.81)
RDW: 13.1 % (ref 11.5–15.5)
WBC: 11.1 10*3/uL — AB (ref 4.0–10.5)

## 2018-06-01 LAB — BASIC METABOLIC PANEL
Anion gap: 9 (ref 5–15)
BUN: 10 mg/dL (ref 6–20)
CALCIUM: 9.3 mg/dL (ref 8.9–10.3)
CHLORIDE: 102 mmol/L (ref 98–111)
CO2: 27 mmol/L (ref 22–32)
CREATININE: 1 mg/dL (ref 0.61–1.24)
GFR calc non Af Amer: >60 mL/min (ref >60)
Glucose, Bld: 119 mg/dL — ABNORMAL HIGH (ref 70–99)
Potassium: 3.8 mmol/L (ref 3.5–5.1)
SODIUM: 138 mmol/L (ref 135–145)

## 2018-06-01 LAB — I-STAT TROPONIN, ED: Troponin i, poc: 0 ng/mL (ref 0.00–0.08)

## 2018-06-01 MED ORDER — SODIUM CHLORIDE 0.9% FLUSH: 3.0000 mL | Freq: Once | INTRAVENOUS | Status: DC

## 2018-06-01 MED ORDER — IBUPROFEN 800 MG PO TABS: 800.0000 mg | ORAL_TABLET | Freq: Three times a day (TID) | ORAL | 0 refills | Status: AC

## 2018-06-01 MED ORDER — IBUPROFEN 800 MG PO TABS
800.0000 mg | ORAL_TABLET | Freq: Once | ORAL | Status: AC
  Administered 2018-06-01: 800 mg via ORAL
  Filled 2018-06-01: qty 1

## 2018-06-01 NOTE — ED Notes (Signed)
Pt taken to xray when this NT was coming to get the blood, notified Sort NT to let this Haley(NT) know when pt is back from xray. Haley(NT) acknowledged.

## 2018-06-01 NOTE — ED Triage Notes (Signed)
Co constant sharp L sided chest pain that radiates to L arm x 40 min with SOB.  Denies nausea and vomiting.

## 2018-06-01 NOTE — ED Provider Notes (Signed)
[facility] EMERGENCY DEPARTMENT Provider Note   CSN: [id number] Arrival date & time: 06/01/18  0121     History   Chief Complaint Chief Complaint  Patient presents with  . Chest Pain    HPI Alan Lamb is a 28 y.o. male.   Chest Pain  Pain location:  L chest Pain quality: sharp   Pain radiates to:  Does not radiate Pain severity:  Mild Duration:  1 week Timing:  Constant Progression:  Unchanged Chronicity:  New Worsened by:  Coughing   Past Medical History:  Diagnosis Date  . Hypertension     There are no active problems to display for this patient.   History reviewed. No pertinent surgical history.      Home Medications    Prior to Admission medications   Medication Sig Start Date End Date Taking? Authorizing Provider  dicyclomine (BENTYL) 20 MG tablet Take 1 tablet (20 mg total) by mouth 2 (two) times daily. 05/11/15   Pisciotta, Joni Reining, PA-C  diphenhydrAMINE (BENADRYL) 25 MG tablet Take 1 tablet (25 mg total) by mouth at bedtime as needed. 10/15/17   Janne Napoleon, NP  famotidine (PEPCID) 20 MG tablet Take 1 tablet (20 mg total) by mouth 2 (two) times daily. 05/11/15   Pisciotta, Joni Reining, PA-C  ibuprofen (ADVIL,MOTRIN) 800 MG tablet Take 1 tablet (800 mg total) by mouth 3 (three) times daily. 06/01/18   Tranice Laduke, Barbara Cower, MD  ondansetron (ZOFRAN) 4 MG tablet Take 1 tablet (4 mg total) by mouth every 8 (eight) hours as needed for nausea or vomiting. 05/11/15   Pisciotta, Joni Reining, PA-C  ranitidine (ZANTAC) 150 MG tablet Take 1 tablet (150 mg total) by mouth 2 (two) times daily. 10/15/17   Janne Napoleon, NP    Family History Family History  Problem Relation Age of Onset  . Hypertension Other     Social History Social History   Tobacco Use  . Smoking status: Current Some Day Smoker    Packs/day: 0.50    Types: Cigarettes  . Smokeless tobacco: Never Used  Substance Use Topics  . Alcohol use: No  . Drug use: No    Frequency: 14.0  times per week     Allergies   Other   Review of Systems Review of Systems  Cardiovascular: Positive for chest pain.  All other systems reviewed and are negative.    Physical Exam Updated Vital Signs BP (!) 149/96 (BP Location: Right Arm)   Pulse 69   Temp 98.6 F (37 C) (Oral)   Resp (!) 22   Ht 5\' 10"  (1.778 m)   Wt 70.3 kg   SpO2 99%   BMI 22.24 kg/m   Physical Exam Vitals signs and nursing note reviewed.  Constitutional:      Appearance: He is well-developed.  HENT:     Head: Normocephalic and atraumatic.     Mouth/Throat:     Mouth: Mucous membranes are moist.  Eyes:     Extraocular Movements: Extraocular movements intact.     Pupils: Pupils are equal, round, and reactive to light.  Neck:     Musculoskeletal: Normal range of motion.  Cardiovascular:     Rate and Rhythm: Normal rate.  Pulmonary:     Effort: Pulmonary effort is normal. No respiratory distress.  Chest:     Chest wall: Tenderness (anterior chest over sternum) present. No mass.  Abdominal:     General: There is no distension.     Palpations: Abdomen  is soft.  Musculoskeletal: Normal range of motion.  Skin:    General: Skin is warm and dry.  Neurological:     Mental Status: He is alert.      ED Treatments / Results  Labs (all labs ordered are listed, but only abnormal results are displayed) Labs Reviewed  BASIC METABOLIC PANEL - Abnormal; Notable for the following components:      Result Value   Glucose, Bld 119 (*)    All other components within normal limits  CBC - Abnormal; Notable for the following components:   WBC 11.1 (*)    All other components within normal limits  I-STAT TROPONIN, ED    EKG EKG Interpretation  Date/Time:  Monday June 01 2018 01:30:42 EST Ventricular Rate:  91 PR Interval:  154 QRS Duration: 106 QT Interval:  336 QTC Calculation: 413 R Axis:   127 Text Interpretation:  Normal sinus rhythm Right axis deviation Pulmonary disease pattern  Incomplete right bundle branch block Possible Right ventricular hypertrophy Abnormal ECG No acute changes compared to october Confirmed by Marily Memos [phone]) on 06/01/2018 1:35:09 AM Also confirmed by Marily Memos [phone]), editor Barbette Hair [phone])  on 06/01/2018 7:42:36 AM   Radiology Dg Chest 2 View  Result Date: 06/01/2018 CLINICAL DATA:  Left-sided chest pain EXAM: CHEST - 2 VIEW COMPARISON:  03/01/2018 FINDINGS: The heart size and mediastinal contours are within normal limits. Both lungs are clear. The visualized skeletal structures are unremarkable. IMPRESSION: No active cardiopulmonary disease. Electronically Signed   By: Jasmine Pang M.D.   On: 06/01/2018 01:53    Procedures Procedures (including critical care time)  Medications Ordered in ED Medications  ibuprofen (ADVIL,MOTRIN) tablet 800 mg (800 mg Oral Given 06/01/18 0429)     Initial Impression / Assessment and Plan / ED Course  I have reviewed the triage vital signs and the nursing notes.  Pertinent labs & imaging results that were available during my care of the patient were reviewed by me and considered in my medical decision making (see chart for details).  Pericarditis versus costochondritis as a cause for symptoms.  More likely pericarditis is better when he leans forward and worse when he lays flat. treatment same.   Final Clinical Impressions(s) / ED Diagnoses   Final diagnoses:  Nonspecific chest pain  Hypertension, unspecified type    ED Discharge Orders         Ordered    ibuprofen (ADVIL,MOTRIN) 800 MG tablet  3 times daily     06/01/18 0415           Zarion Oliff, Barbara Cower, MD 06/01/18 [phone]

## 2018-06-26 ENCOUNTER — Emergency Department (HOSPITAL_COMMUNITY)
Admission: EM | Admit: 2018-06-26 | Discharge: 2018-06-26 | Disposition: A | Discharge status: Home / Self Care | Payer: Medicaid Other | Attending: Emergency Medicine | Admitting: Emergency Medicine

## 2018-06-26 ENCOUNTER — Encounter (HOSPITAL_COMMUNITY): Payer: Self-pay | Admitting: Emergency Medicine

## 2018-06-26 DIAGNOSIS — K0889 Other specified disorders of teeth and supporting structures: Secondary | ICD-10-CM

## 2018-06-26 DIAGNOSIS — F17210 Nicotine dependence, cigarettes, uncomplicated: Secondary | ICD-10-CM | POA: Insufficient documentation

## 2018-06-26 DIAGNOSIS — Z79899 Other long term (current) drug therapy: Secondary | ICD-10-CM | POA: Insufficient documentation

## 2018-06-26 DIAGNOSIS — I10 Essential (primary) hypertension: Secondary | ICD-10-CM | POA: Insufficient documentation

## 2018-06-26 MED ORDER — AMOXICILLIN-POT CLAVULANATE 875-125 MG PO TABS: 1.0000 | ORAL_TABLET | Freq: Two times a day (BID) | ORAL | 0 refills | Status: AC

## 2018-06-26 MED ORDER — IBUPROFEN 400 MG PO TABS
600.0000 mg | ORAL_TABLET | Freq: Once | ORAL | Status: AC
  Administered 2018-06-26: 600 mg via ORAL
  Filled 2018-06-26: qty 1

## 2018-06-26 MED ORDER — NAPROXEN 500 MG PO TABS: 500.0000 mg | ORAL_TABLET | Freq: Two times a day (BID) | ORAL | 0 refills | Status: DC

## 2018-06-26 NOTE — ED Notes (Signed)
Patient verbalized understanding of discharge instructions and denies any further needs or questions at this time. VS stable. Patient ambulatory with steady gait.  

## 2018-06-26 NOTE — ED Triage Notes (Signed)
Pt here from home with a sore to his left upper gum that started 1 day ago

## 2018-06-26 NOTE — Discharge Instructions (Addendum)
You have been seen today for dental pain. Please read and follow all provided instructions.   1. Medications: Augmentin (antibiotic), naproxen for pain, usual home medications 2. Treatment: rest, drink plenty of fluids 3. Follow Up: Please follow up with your primary doctor or dentist in 2 days for discussion of your diagnoses and further evaluation after today's visit; if you do not have a primary care doctor use the resource guide provided to find one; Please return to the ER for any new or worsening symptoms. Please obtain all of your results from medical records or have your doctors office obtain the results - share them with your doctor - you should be seen at your doctors office. Call today to arrange your follow up.   Take medications as prescribed. Please review all of the medicines and only take them if you do not have an allergy to them. Return to the emergency room for worsening condition or new concerning symptoms. Follow up with your regular doctor. If you don't have a regular doctor use one of the numbers below to establish a primary care doctor.  Please be aware that if you are taking birth control pills, taking other prescriptions, ESPECIALLY ANTIBIOTICS may make the birth control ineffective - if this is the case, either do not engage in sexual activity or use alternative methods of birth control such as condoms until you have finished the medicine and your family doctor says it is OK to restart them. If you are on a blood thinner such as COUMADIN, be aware that any other medicine that you take may cause the coumadin to either work too much, or not enough - you should have your coumadin level rechecked in next 7 days if this is the case.  ?  It is also a possibility that you have an allergic reaction to any of the medicines that you have been prescribed - Everybody reacts differently to medications and while MOST people have no trouble with most medicines, you may have a reaction such as  nausea, vomiting, rash, swelling, shortness of breath. If this is the case, please stop taking the medicine immediately and contact your physician.  ?  You should return to the ER if you develop severe or worsening symptoms.   Emergency Department Resource Guide 1) Find a Doctor and Pay Out of Pocket Although you won't have to find out who is covered by your insurance plan, it is a good idea to ask around and get recommendations. You will then need to call the office and see if the doctor you have chosen will accept you as a new patient and what types of options they offer for patients who are self-pay. Some doctors offer discounts or will set up payment plans for their patients who do not have insurance, but you will need to ask so you aren't surprised when you get to your appointment.  2) Contact Your Local Health Department Not all health departments have doctors that can see patients for sick visits, but many do, so it is worth a call to see if yours does. If you don't know where your local health department is, you can check in your phone book. The CDC also has a tool to help you locate your state's health department, and many state websites also have listings of all of their local health departments.  3) Find a Walk-in Clinic If your illness is not likely to be very severe or complicated, you may want to try a walk in  clinic. These are popping up all over the country in pharmacies, drugstores, and shopping centers. They're usually staffed by nurse practitioners or physician assistants that have been trained to treat common illnesses and complaints. They're usually fairly quick and inexpensive. However, if you have serious medical issues or chronic medical problems, these are probably not your best option.  No Primary Care Doctor: Call Health Connect at  [phone] - they can help you locate a primary care doctor that  accepts your insurance, provides certain services, etc. Physician Referral  [phone]  Emergency Department Resource Guide 1) Find a Doctor and Pay Out of Pocket Although you won't have to find out who is covered by your insurance plan, it is a good idea to ask around and get recommendations. You will then need to call the office and see if the doctor you have chosen will accept you as a new patient and what types of options they offer for patients who are self-pay. Some doctors offer discounts or will set up payment plans for their patients who do not have insurance, but you will need to ask so you aren't surprised when you get to your appointment.  2) Contact Your Local Health Department Not all health departments have doctors that can see patients for sick visits, but many do, so it is worth a call to see if yours does. If you don't know where your local health department is, you can check in your phone book. The CDC also has a tool to help you locate your state's health department, and many state websites also have listings of all of their local health departments.  3) Find a [facility] If your illness is not likely to be very severe or complicated, you may want to try a walk in clinic. These are popping up all over the country in pharmacies, drugstores, and shopping centers. They're usually staffed by nurse practitioners or physician assistants that have been trained to treat common illnesses and complaints. They're usually fairly quick and inexpensive. However, if you have serious medical issues or chronic medical problems, these are probably not your best option.  No Primary Care Doctor: Call Health Connect at  [phone] - they can help you locate a primary care doctor that  accepts your insurance, provides certain services, etc. Physician Referral Service- [phone]  Chronic Pain Problems: Organization         Address  Phone   Notes  [facility]  [phone] Patients need to be referred by their primary care doctor.    Medication Assistance: Organization         Address  Phone   Notes  [facility] Medication [facility] [address] [place], [place] [address] [phone] --Must be a resident of [facility] -- Must have NO insurance coverage whatsoever (no Medicaid/ Medicare, etc.) -- The pt. MUST have a primary care doctor that directs their care regularly and follows them in the community   [facility]  [phone]   [employer]  [phone]    Agencies that provide inexpensive medical care: Organization         Address  Phone   Notes  [place]  [phone]   [facility]    [phone]   [facility] [place], [place] [address] [phone]   [place] [address] [place] [phone]   [facility]anned [facility]    (  [phone]   [facility]    [phone]   [facility]  [address], [place] Phone:  [phone], Fax:  [phone] Hours of Operation:  9 am - 6 pm, M-F.  Also accepts Medicaid/Medicare and self-pay.  [facility] for [place], [address], [place] Phone: [phone], Fax: [phone]. Hours of Operation:  8:30 am - 5:30 pm, M-F.  Also accepts Medicaid and self-pay.  [facility] [place] [address], [place] Phone: [phone]   [place], [place], [place] [phone], Ext. [phone] Mondays & Thursdays: 7-9 AM.  First 15 patients are seen on a first come, first serve basis.    [place] Providers:  Organization         Address  Phone   Notes  [facility] [address], [place] [phone] Also accepts self-pay patients.  [facility] [address], [place]  [phone]   [place], [address], [place] [phone]   [facility] [address] [place] [phone]   Lucianne Lei [address], [place]   [phone] Only accepts [place] [facility] [place] patients after they have their name applied to their card.   Self-Pay (no insurance) in [facility]:  Organization         Address  Phone   Notes  Sickle Cell Patients, [facility] [place] [phone]   [facility] [place] [phone]   [facility] [place]  [place], [place], [place] [phone]   [facility]/Dr. Osei-Bonsu  [address], [place] or [address], [place] [phone] Phone number for both [place] and [place] locations is the same.  [facility] [address], [place] [phone]   [facility] [address] [place] or [address] [phone]   [facility] [address] [place] [phone], phone; [phone], fax Sees patients 1st and 3rd Saturday of every month.  Must not qualify for public or private insurance (i.e. Medicaid, Medicare, [place] Health Choice, Veterans' Benefits)  Household income should be no more than 200% of the poverty level The clinic cannot treat you if you are pregnant or think you are pregnant  Sexually transmitted diseases are not treated at the clinic.

## 2018-06-26 NOTE — ED Provider Notes (Signed)
[facility] EMERGENCY DEPARTMENT Provider Note   CSN: [id number] Arrival date & time: 06/26/18  9179     History   Chief Complaint No chief complaint on file.   HPI Alan Lamb is a 28 y.o. male with a PMH of HTN presenting with constant left upper non radiating dental pain onset yesterday. Patient describes pain as an ache and states it is worse with chewing. Patient reports taking tylenol with partial relief. Patient states he does not have a dentist. Patient reports breaking a tooth last year after eating hard food. Patient denies fever, shortness of breath, difficulty swallowing, sore throat, neck pain, nausea, vomiting, or abdominal pain. Patient denies drainage. Patient states he was able to eat and drink last night without difficulty.   HPI  Past Medical History:  Diagnosis Date  . Hypertension     There are no active problems to display for this patient.   History reviewed. No pertinent surgical history.      Home Medications    Prior to Admission medications   Medication Sig Start Date End Date Taking? Authorizing Provider  amoxicillin-clavulanate (AUGMENTIN) 875-125 MG tablet Take 1 tablet by mouth every 12 (twelve) hours for 7 days. 06/26/18 07/03/18  Carlyle Basques P, PA-C  dicyclomine (BENTYL) 20 MG tablet Take 1 tablet (20 mg total) by mouth 2 (two) times daily. 05/11/15   Pisciotta, Joni Reining, PA-C  diphenhydrAMINE (BENADRYL) 25 MG tablet Take 1 tablet (25 mg total) by mouth at bedtime as needed. 10/15/17   Janne Napoleon, NP  famotidine (PEPCID) 20 MG tablet Take 1 tablet (20 mg total) by mouth 2 (two) times daily. 05/11/15   Pisciotta, Joni Reining, PA-C  ibuprofen (ADVIL,MOTRIN) 800 MG tablet Take 1 tablet (800 mg total) by mouth 3 (three) times daily. 06/01/18   Mesner, Barbara Cower, MD  naproxen (NAPROSYN) 500 MG tablet Take 1 tablet (500 mg total) by mouth 2 (two) times daily. 06/26/18   Carlyle Basques P, PA-C  ondansetron (ZOFRAN) 4 MG tablet Take 1  tablet (4 mg total) by mouth every 8 (eight) hours as needed for nausea or vomiting. 05/11/15   Pisciotta, Joni Reining, PA-C  ranitidine (ZANTAC) 150 MG tablet Take 1 tablet (150 mg total) by mouth 2 (two) times daily. 10/15/17   Janne Napoleon, NP    Family History Family History  Problem Relation Age of Onset  . Hypertension Other     Social History Social History   Tobacco Use  . Smoking status: Current Some Day Smoker    Packs/day: 0.50    Types: Cigarettes  . Smokeless tobacco: Never Used  Substance Use Topics  . Alcohol use: No  . Drug use: No    Frequency: 14.0 times per week     Allergies   Other   Review of Systems Review of Systems  Constitutional: Negative for chills, diaphoresis and fever.  HENT: Positive for dental problem. Negative for congestion, rhinorrhea, sore throat, trouble swallowing and voice change.   Eyes: Negative for pain.  Respiratory: Negative for cough and shortness of breath.   Cardiovascular: Negative for chest pain.  Gastrointestinal: Negative for abdominal pain, nausea and vomiting.  Endocrine: Negative for cold intolerance and heat intolerance.  Musculoskeletal: Negative for neck pain and neck stiffness.  Skin: Negative for rash and wound.  Allergic/Immunologic: Negative for immunocompromised state.  Neurological: Negative for headaches.  Hematological: Negative for adenopathy.    Physical Exam Updated Vital Signs BP (!) 165/84 (BP Location: Right Arm)  Pulse 91   Temp 98.3 F (36.8 C) (Oral)   Resp 17   Ht 5\' 10"  (1.778 m)   Wt 76.7 kg   SpO2 99%   BMI 24.25 kg/m   Physical Exam Physical Exam  Constitutional: Pt appears well-developed and well-nourished.  HENT:  Head: Normocephalic.  Right Ear: Tympanic membrane, external ear and ear canal normal.  Left Ear: Tympanic membrane, external ear and ear canal normal.  Nose: Nose normal. Right sinus exhibits no maxillary sinus tenderness and no frontal sinus tenderness. Left sinus  exhibits no maxillary sinus tenderness and no frontal sinus tenderness.  Mouth/Throat: Uvula is midline, oropharynx is clear and moist and mucous membranes are normal. No oral lesions. Abnormal dentition. Dental Caries present. Left upper first molar is broken. No uvula swelling or lacerations. No oropharyngeal exudate, posterior oropharyngeal edema, posterior oropharyngeal erythema or tonsillar abscesses.  Mild gingival swelling. Small abscess noted over left upper gingiva above 1st molar. Abscess is not large enough to warrant incision and drainage. No signs of Ludwig's angina noted. Eyes: Conjunctivae are normal. Pupils are equal, round, and reactive to light. Right eye exhibits no discharge. Left eye exhibits no discharge.  Neck: Normal range of motion. Neck supple. No stridor. Handling secretions without difficulty. No nuchal rigidity. No cervical lymphadenopathy Cardiovascular: Normal rate, regular rhythm and normal heart sounds.   Pulmonary/Chest: Effort normal. No respiratory distress. Equal chest rise. Abdominal: Abdomen is soft and non tender. Pt exhibits no distension.  Lymphadenopathy: Pt has no cervical adenopathy.  Neurological: Pt is alert.  Skin: Skin is warm and dry.  Psychiatric: Pt has a normal mood and affect.  Nursing note and vitals reviewed.   ED Treatments / Results  Labs (all labs ordered are listed, but only abnormal results are displayed) Labs Reviewed - No data to display  EKG None  Radiology No results found.  Procedures Procedures (including critical care time)  Medications Ordered in ED Medications  ibuprofen (ADVIL,MOTRIN) tablet 600 mg (has no administration in time range)     Initial Impression / Assessment and Plan / ED Course  I have reviewed the triage vital signs and the nursing notes.  Pertinent labs & imaging results that were available during my care of the patient were reviewed by me and considered in my medical decision making (see  chart for details).    Patient with toothache.  Small gross abscess noted. Abscess is not large enough to warrant incision and drainage.  Exam unconcerning for Ludwig's angina or spread of infection.  Will treat with augmentin and anti-inflammatories medicine.  Urged patient to follow-up with dentist.  Discussed return precautions with patient.Provided dental resources. Discussed elevated BP with patient. No signs of hypertensive urgency. Advised patient to follow up with PCP regarding BP control. Patient states he understands and agrees with plan.    Final Clinical Impressions(s) / ED Diagnoses   Final diagnoses:  Pain, dental    ED Discharge Orders         Ordered    amoxicillin-clavulanate (AUGMENTIN) 875-125 MG tablet  Every 12 hours     06/26/18 0830    naproxen (NAPROSYN) 500 MG tablet  2 times daily     06/26/18 0830           Carlyle Basques [place], [place] 06/26/18 5974    Mancel Bale, MD 06/26/18 1606

## 2019-11-12 IMAGING — DX DG CHEST 2V
2 series · 2 of 2 positions shown · non-contrast
Comparison: 03/01/2018

CLINICAL DATA: Left-sided chest pain

EXAM:
CHEST - 2 VIEW

[chest pa]
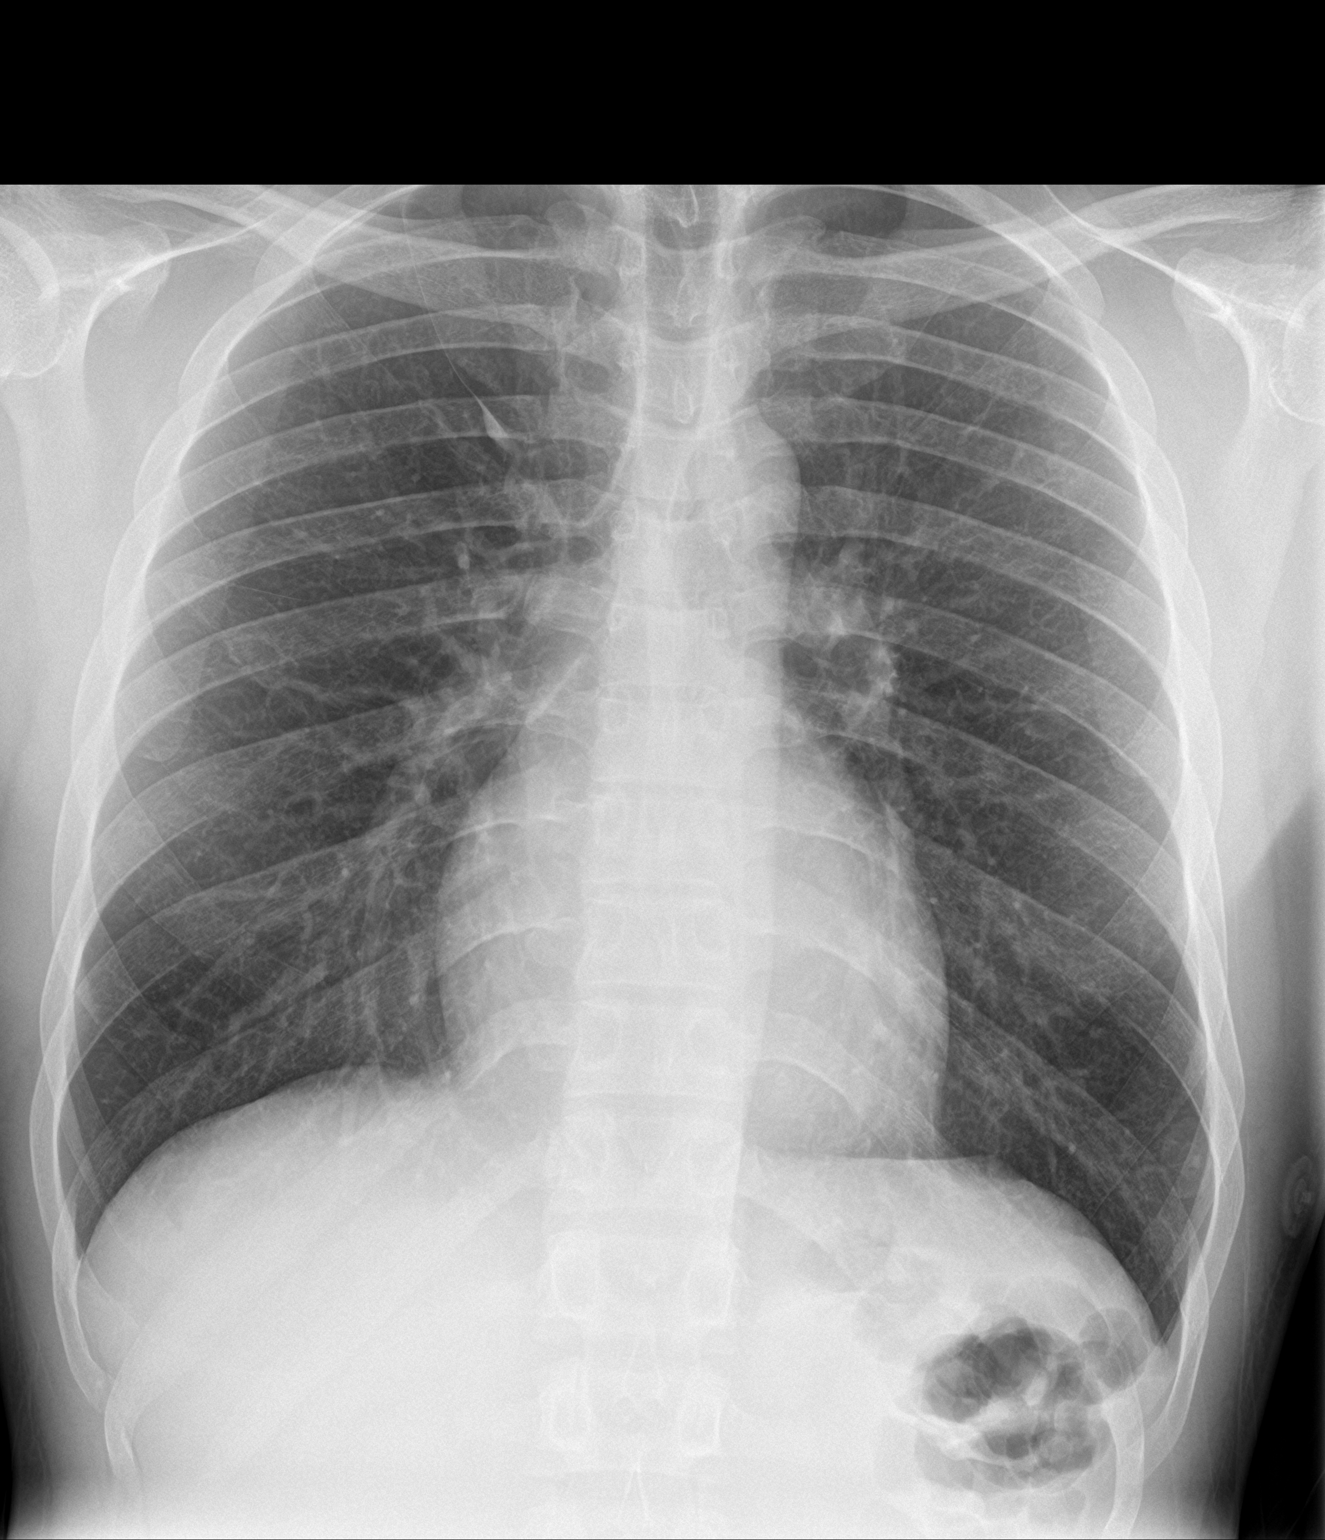

[chest lat]
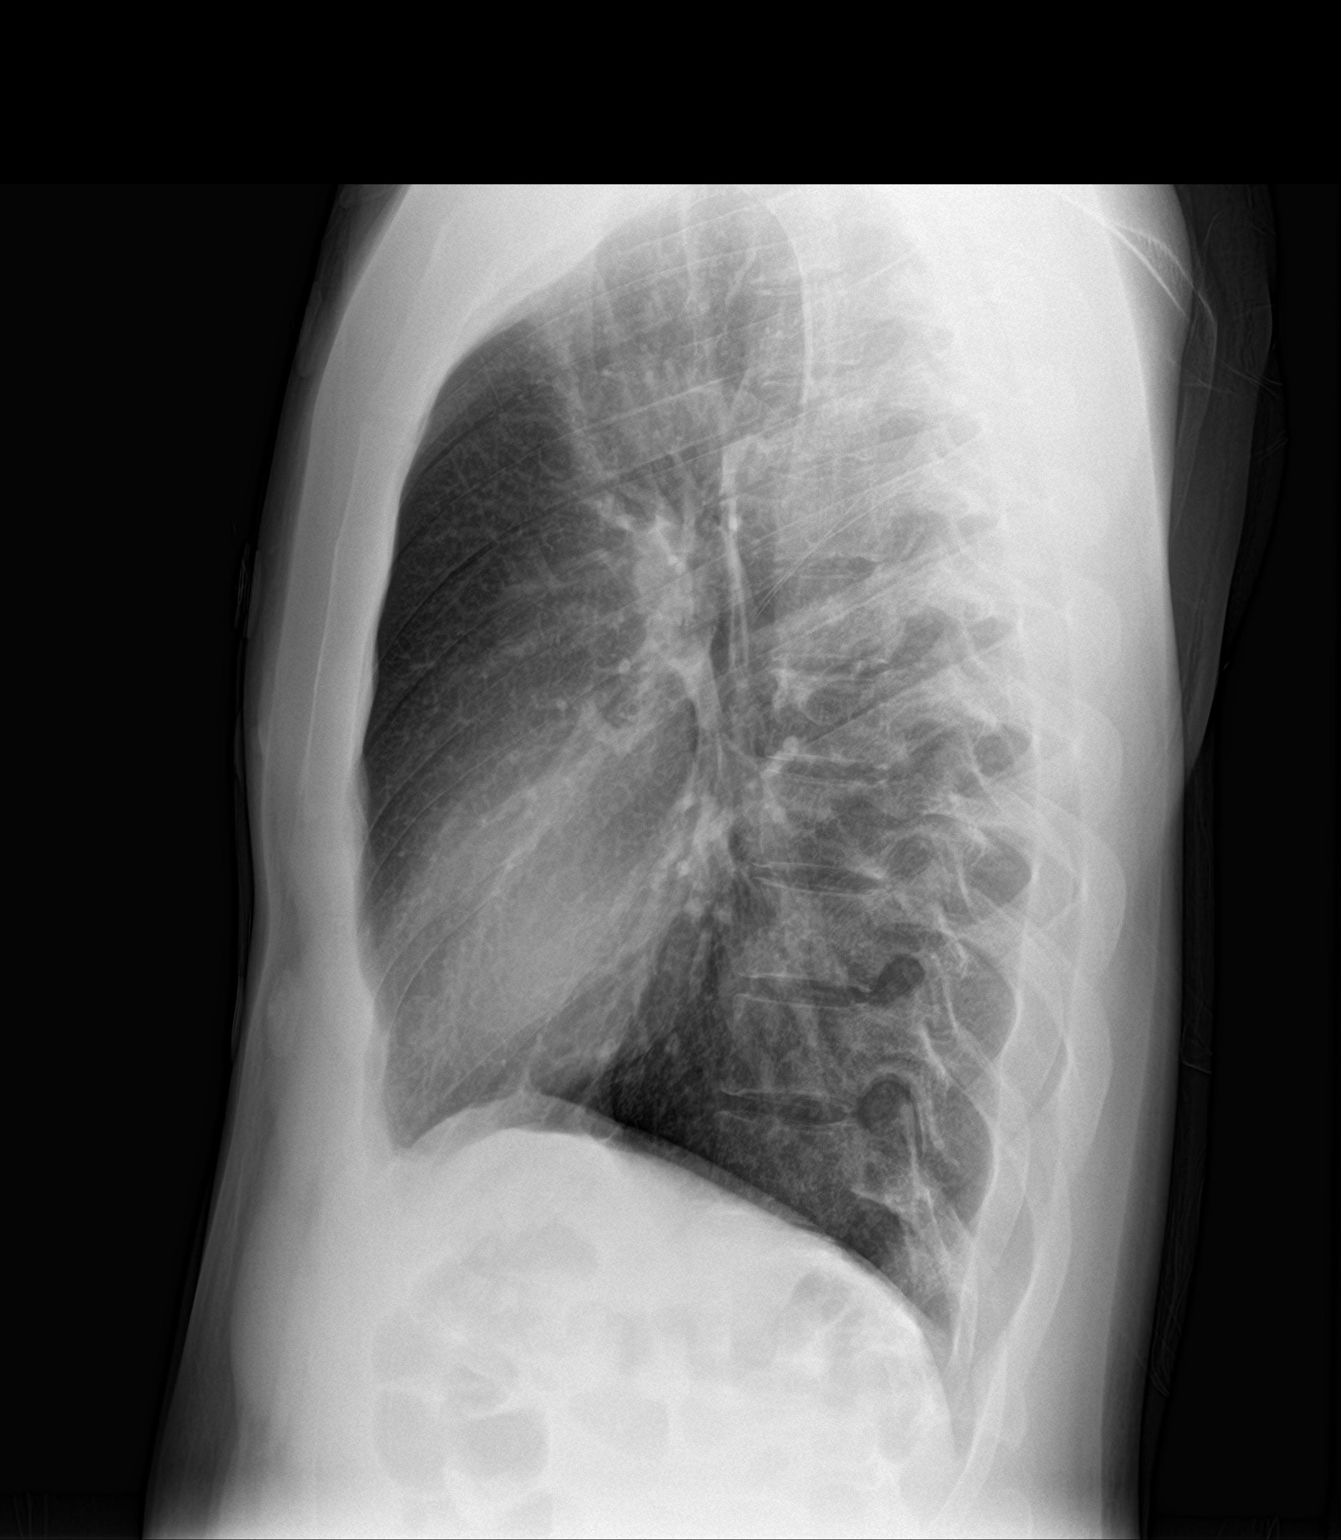

[2 of 2 positions shown; findings below may reference images not displayed]

FINDINGS: The heart size and mediastinal contours are within normal limits.
Both lungs are clear. The visualized skeletal structures are
unremarkable.
IMPRESSION: No active cardiopulmonary disease.

## 2019-12-01 ENCOUNTER — Other Ambulatory Visit: Payer: Self-pay

## 2020-10-05 ENCOUNTER — Encounter (HOSPITAL_COMMUNITY): Payer: Self-pay | Admitting: Emergency Medicine

## 2020-10-15 ENCOUNTER — Emergency Department (HOSPITAL_COMMUNITY)
Admission: EM | Admit: 2020-10-15 | Discharge: 2020-10-15 | Disposition: A | Discharge status: Home / Self Care | Payer: Self-pay | Attending: Emergency Medicine | Admitting: Emergency Medicine

## 2020-10-15 ENCOUNTER — Other Ambulatory Visit: Payer: Self-pay

## 2020-10-15 ENCOUNTER — Encounter (HOSPITAL_COMMUNITY): Payer: Self-pay | Admitting: Emergency Medicine

## 2020-10-15 DIAGNOSIS — I10 Essential (primary) hypertension: Secondary | ICD-10-CM | POA: Insufficient documentation

## 2020-10-15 DIAGNOSIS — U071 COVID-19: Secondary | ICD-10-CM | POA: Insufficient documentation

## 2020-10-15 DIAGNOSIS — F17210 Nicotine dependence, cigarettes, uncomplicated: Secondary | ICD-10-CM | POA: Insufficient documentation

## 2020-10-15 DIAGNOSIS — J029 Acute pharyngitis, unspecified: Secondary | ICD-10-CM | POA: Insufficient documentation

## 2020-10-15 DIAGNOSIS — K0889 Other specified disorders of teeth and supporting structures: Secondary | ICD-10-CM | POA: Insufficient documentation

## 2020-10-15 MED ORDER — NAPROXEN 500 MG PO TABS: 500.0000 mg | ORAL_TABLET | Freq: Two times a day (BID) | ORAL | 0 refills | Status: AC

## 2020-10-15 MED ORDER — LIDOCAINE VISCOUS HCL 2 % MT SOLN
15.0000 mL | OROMUCOSAL | 0 refills | Status: AC | PRN
Start: 2020-10-15 — End: ?

## 2020-10-15 NOTE — Discharge Instructions (Signed)
Use lidocaine to swish and spit to help with throat discomfort. Take the naproxen as needed for pain. Follow-up with your primary care provider. Return to the ER if you start to experience worsening sore throat, shortness of breath, chest pain or trouble opening your mouth.

## 2020-10-15 NOTE — ED Provider Notes (Signed)
[facility]-EMERGENCY DEPT Provider Note   CSN: [id number] Arrival date & time: 10/15/20  1411     History No chief complaint on file.   Alan Lamb is a 30 y.o. male with a past medical history of hypertension presenting to the ED with a chief complaint of sore throat.  He noticed on 10/09/2020 that he began having a left-sided sore throat.  Symptoms persisted he had other flulike symptoms as well.  He tested positive for COVID 2 days ago but states that his other symptoms have subsided.  He also endorses having left-sided wisdom tooth pain but he is unsure if that is related.  He has not daily medications to help with pain.  Denies any trismus, drooling or neck stiffness.  No cough, shortness of breath or myalgias  HPI     Past Medical History:  Diagnosis Date  . Hypertension     There are no problems to display for this patient.   History reviewed. No pertinent surgical history.     Family History  Problem Relation Age of Onset  . Hypertension Other     Social History   Tobacco Use  . Smoking status: Current Some Day Smoker    Packs/day: 0.50    Types: Cigarettes  . Smokeless tobacco: Never Used  Substance Use Topics  . Alcohol use: No  . Drug use: No    Frequency: 14.0 times per week    Home Medications Prior to Admission medications   Medication Sig Start Date End Date Taking? Authorizing Provider  lidocaine (XYLOCAINE) 2 % solution Use as directed 15 mLs in the mouth or throat as needed for mouth pain. 10/15/20  Yes Riaz Onorato, PA-C  naproxen (NAPROSYN) 500 MG tablet Take 1 tablet (500 mg total) by mouth 2 (two) times daily. 10/15/20  Yes Lida Berkery, PA-C  dicyclomine (BENTYL) 20 MG tablet Take 1 tablet (20 mg total) by mouth 2 (two) times daily. 05/11/15   Pisciotta, Joni Reining, PA-C  diphenhydrAMINE (BENADRYL) 25 MG tablet Take 1 tablet (25 mg total) by mouth at bedtime as needed. 10/15/17   Janne Napoleon, NP  famotidine (PEPCID) 20 MG  tablet Take 1 tablet (20 mg total) by mouth 2 (two) times daily. 05/11/15   Pisciotta, Joni Reining, PA-C  ibuprofen (ADVIL,MOTRIN) 800 MG tablet Take 1 tablet (800 mg total) by mouth 3 (three) times daily. 06/01/18   Mesner, Barbara Cower, MD  ondansetron (ZOFRAN) 4 MG tablet Take 1 tablet (4 mg total) by mouth every 8 (eight) hours as needed for nausea or vomiting. 05/11/15   Pisciotta, Joni Reining, PA-C  ranitidine (ZANTAC) 150 MG tablet Take 1 tablet (150 mg total) by mouth 2 (two) times daily. 10/15/17   Janne Napoleon, NP    Allergies    Other  Review of Systems   Review of Systems  Constitutional: Negative for chills and fever.  HENT: Positive for sore throat. Negative for facial swelling, sinus pain and trouble swallowing.   Respiratory: Negative for cough and shortness of breath.   Gastrointestinal: Negative for vomiting.    Physical Exam Updated Vital Signs BP (!) 138/92 (BP Location: Right Arm)   Pulse 77   Temp 98.9 F (37.2 C) (Oral)   Resp 16   Ht 5\' 10"  (1.778 m)   Wt 72.6 kg   SpO2 96%   BMI 22.96 kg/m   Physical Exam Vitals and nursing note reviewed.  Constitutional:      General: He is not in acute  distress.    Appearance: He is well-developed. He is not diaphoretic.  HENT:     Head: Normocephalic and atraumatic.     Mouth/Throat:     Pharynx: Uvula midline.     Tonsils: No tonsillar exudate or tonsillar abscesses. 0 on the right. 0 on the left.     Comments: Patient does not appear to be in acute distress. No trismus or drooling present. No pooling of secretions. Patient is tolerating secretions and is not in respiratory distress. No neck pain or tenderness to palpation of the neck. Full active and passive range of motion of the neck. No evidence of RPA or PTA. Eyes:     General: No scleral icterus.    Conjunctiva/sclera: Conjunctivae normal.  Pulmonary:     Effort: Pulmonary effort is normal. No respiratory distress.  Musculoskeletal:     Cervical back: Normal range of  motion.  Skin:    Findings: No rash.  Neurological:     Mental Status: He is alert.     ED Results / Procedures / Treatments   Labs (all labs ordered are listed, but only abnormal results are displayed) Labs Reviewed - No data to display  EKG None  Radiology No results found.  Procedures Procedures   Medications Ordered in ED Medications - No data to display  ED Course  I have reviewed the triage vital signs and the nursing notes.  Pertinent labs & imaging results that were available during my care of the patient were reviewed by me and considered in my medical decision making (see chart for details).    MDM Rules/Calculators/A&P                          Alan Lamb was evaluated in Emergency Department on 10/15/20 for the symptoms described in the history of present illness. He/she was evaluated in the context of the global COVID-19 pandemic, which necessitated consideration that the patient might be at risk for infection with the SARS-CoV-2 virus that causes COVID-19. Institutional protocols and algorithms that pertain to the evaluation of patients at risk for COVID-19 are in a state of rapid change based on information released by regulatory bodies including the CDC and federal and state organizations. These policies and algorithms were followed during the patient's care in the ED.  30 year old male presenting to the ED with a chief complaint of sore throat for about 6 days.  He also tested positive for COVID 2 days ago.  He denies any shortness of breath, trismus, drooling, neck stiffness, myalgias, chest pain or cough.  Physical exam findings without any erythema, exudate, enlargement of the tonsils noted.  He is in no acute distress.  Oxygen saturations above 96% on room air.  He is afebrile without recent use of antipyretics.  I suspect that his symptoms are due to his COVID infection.  Pt is tolerating secretions, not in respiratory distress, no neck pain, no  trismus. Presentation not concerning for peritonsillar abscess or spread of infection to deep spaces of the throat; patent airway. Pt will be discharged with lidocaine to swish and spit, naproxen as needed for pain/fever. Specific return precautions discussed. Recommended PCP follow up. Pt appears safe for discharge.    Patient is hemodynamically stable, in NAD, and able to ambulate in the ED. Evaluation does not show pathology that would require ongoing emergent intervention or inpatient treatment. I explained the diagnosis to the patient. Pain has been managed and has no  complaints prior to discharge. Patient is comfortable with above plan and is stable for discharge at this time. All questions were answered prior to disposition. Strict return precautions for returning to the ED were discussed. Encouraged follow up with PCP.   An After Visit Summary was printed and given to the patient.   Portions of this note were generated with Scientist, clinical (histocompatibility and immunogenetics). Dictation errors may occur despite best attempts at proofreading.   Final Clinical Impression(s) / ED Diagnoses Final diagnoses:  COVID-19 virus infection  Viral pharyngitis    Rx / DC Orders ED Discharge Orders         Ordered    lidocaine (XYLOCAINE) 2 % solution  As needed        10/15/20 1432    naproxen (NAPROSYN) 500 MG tablet  2 times daily        10/15/20 1432           Dietrich Pates, PA-C 10/15/20 1439    Lorre Nick, MD 10/15/20 [phone]

## 2022-10-02 ENCOUNTER — Ambulatory Visit (HOSPITAL_COMMUNITY)
Admission: EM | Admit: 2022-10-02 | Discharge: 2022-10-02 | Disposition: A | Discharge status: Home / Self Care | Payer: Self-pay

## 2022-11-30 ENCOUNTER — Emergency Department (HOSPITAL_COMMUNITY): Payer: Self-pay

## 2022-11-30 ENCOUNTER — Encounter (HOSPITAL_COMMUNITY): Payer: Self-pay | Admitting: *Deleted

## 2022-11-30 ENCOUNTER — Other Ambulatory Visit: Payer: Self-pay

## 2022-11-30 ENCOUNTER — Emergency Department (HOSPITAL_COMMUNITY)
Admission: EM | Admit: 2022-11-30 | Discharge: 2022-11-30 | Disposition: A | Discharge status: Home / Self Care | Payer: Self-pay | Attending: Emergency Medicine | Admitting: Emergency Medicine

## 2022-11-30 DIAGNOSIS — R079 Chest pain, unspecified: Secondary | ICD-10-CM | POA: Insufficient documentation

## 2022-11-30 LAB — BASIC METABOLIC PANEL
Anion gap: 14 (ref 5–15)
BUN: 10 mg/dL (ref 6–20)
CO2: 23 mmol/L (ref 22–32)
Calcium: 9.3 mg/dL (ref 8.9–10.3)
Chloride: 100 mmol/L (ref 98–111)
Creatinine, Ser: 1.1 mg/dL (ref 0.61–1.24)
GFR, Estimated: >60 mL/min (ref >60)
Glucose, Bld: 88 mg/dL (ref 70–99)
Potassium: 4 mmol/L (ref 3.5–5.1)
Sodium: 137 mmol/L (ref 135–145)

## 2022-11-30 LAB — D-DIMER, QUANTITATIVE: D-Dimer, Quant: <0.27 ug/mL-FEU (ref 0.00–0.50)

## 2022-11-30 LAB — CBC
HCT: 43.5 % (ref 39.0–52.0)
Hemoglobin: 15 g/dL (ref 13.0–17.0)
MCH: 30.9 pg (ref 26.0–34.0)
MCHC: 34.5 g/dL (ref 30.0–36.0)
MCV: 89.5 fL (ref 80.0–100.0)
Platelets: 248 10*3/uL (ref 150–400)
RBC: 4.86 MIL/uL (ref 4.22–5.81)
RDW: 13 % (ref 11.5–15.5)
WBC: 9.9 10*3/uL (ref 4.0–10.5)
nRBC: 0 % (ref 0.0–0.2)

## 2022-11-30 LAB — TROPONIN I (HIGH SENSITIVITY)
Troponin I (High Sensitivity): 4 ng/L (ref <18)
Troponin I (High Sensitivity): 5 ng/L (ref <18)

## 2022-11-30 MED ORDER — ALBUTEROL SULFATE HFA 108 (90 BASE) MCG/ACT IN AERS: 2.0000 | INHALATION_SPRAY | RESPIRATORY_TRACT | Status: DC | PRN

## 2022-11-30 MED ORDER — ALBUTEROL SULFATE HFA 108 (90 BASE) MCG/ACT IN AERS
INHALATION_SPRAY | RESPIRATORY_TRACT | Status: AC
  Filled 2022-11-30: qty 6.7

## 2022-11-30 NOTE — ED Provider Notes (Signed)
[place] EMERGENCY DEPARTMENT AT [facility] Provider Note   CSN: [id number] Arrival date & time: 11/30/22  0411     History  Chief Complaint  Patient presents with   Chest Pain    Alan Lamb is a 32 y.o. male.  Presents to the emergency department for evaluation of chest pain.  Patient reports that the pain woke him from sleep.  Pain is sharp in the center of his chest, at times becomes more sharp.  Initially felt slightly short of breath, no longer.  Patient does not have any past medical history.  He reports that his father died of a cardiac arrest in his 35s.       Home Medications Prior to Admission medications   Medication Sig Start Date End Date Taking? Authorizing Provider  dicyclomine (BENTYL) 20 MG tablet Take 1 tablet (20 mg total) by mouth 2 (two) times daily. 05/11/15   Pisciotta, Joni Reining, PA-C  diphenhydrAMINE (BENADRYL) 25 MG tablet Take 1 tablet (25 mg total) by mouth at bedtime as needed. 10/15/17   Janne Napoleon, NP  famotidine (PEPCID) 20 MG tablet Take 1 tablet (20 mg total) by mouth 2 (two) times daily. 05/11/15   Pisciotta, Joni Reining, PA-C  ibuprofen (ADVIL,MOTRIN) 800 MG tablet Take 1 tablet (800 mg total) by mouth 3 (three) times daily. 06/01/18   Mesner, Barbara Cower, MD  lidocaine (XYLOCAINE) 2 % solution Use as directed 15 mLs in the mouth or throat as needed for mouth pain. 10/15/20   Khatri, Hina, PA-C  naproxen (NAPROSYN) 500 MG tablet Take 1 tablet (500 mg total) by mouth 2 (two) times daily. 10/15/20   Khatri, Hina, PA-C  ondansetron (ZOFRAN) 4 MG tablet Take 1 tablet (4 mg total) by mouth Lamb 8 (eight) hours as needed for nausea or vomiting. 05/11/15   Pisciotta, Joni Reining, PA-C  ranitidine (ZANTAC) 150 MG tablet Take 1 tablet (150 mg total) by mouth 2 (two) times daily. 10/15/17   Janne Napoleon, NP      Allergies    Other    Review of Systems   Review of Systems  Physical Exam Updated Vital Signs BP 122/77   Pulse 65   Temp 97.8 F (36.6  C) (Oral)   Resp 19   Ht 5\' 9"  (1.753 m)   Wt 74.8 kg   SpO2 92%   BMI 24.37 kg/m  Physical Exam Vitals and nursing note reviewed.  Constitutional:      General: He is not in acute distress.    Appearance: He is well-developed.  HENT:     Head: Normocephalic and atraumatic.     Mouth/Throat:     Mouth: Mucous membranes are moist.  Eyes:     General: Vision grossly intact. Gaze aligned appropriately.     Extraocular Movements: Extraocular movements intact.     Conjunctiva/sclera: Conjunctivae normal.  Cardiovascular:     Rate and Rhythm: Normal rate and regular rhythm.     Pulses: Normal pulses.     Heart sounds: Normal heart sounds, S1 normal and S2 normal. No murmur heard.    No friction rub. No gallop.  Pulmonary:     Effort: Pulmonary effort is normal. No respiratory distress.     Breath sounds: Normal breath sounds.  Abdominal:     Palpations: Abdomen is soft.     Tenderness: There is no abdominal tenderness. There is no guarding or rebound.     Hernia: No hernia is present.  Musculoskeletal:  General: No swelling.     Cervical back: Full passive range of motion without pain, normal range of motion and neck supple. No pain with movement, spinous process tenderness or muscular tenderness. Normal range of motion.     Right lower leg: No edema.     Left lower leg: No edema.  Skin:    General: Skin is warm and dry.     Capillary Refill: Capillary refill takes less than 2 seconds.     Findings: No ecchymosis, erythema, lesion or wound.  Neurological:     Mental Status: He is alert and oriented to person, place, and time.     GCS: GCS eye subscore is 4. GCS verbal subscore is 5. GCS motor subscore is 6.     Cranial Nerves: Cranial nerves 2-12 are intact.     Sensory: Sensation is intact.     Motor: Motor function is intact. No weakness or abnormal muscle tone.     Coordination: Coordination is intact.  Psychiatric:        Mood and Affect: Mood normal.         Speech: Speech normal.        Behavior: Behavior normal.     ED Results / Procedures / Treatments   Labs (all labs ordered are listed, but only abnormal results are displayed) Labs Reviewed  BASIC METABOLIC PANEL  CBC  D-DIMER, QUANTITATIVE  TROPONIN I (HIGH SENSITIVITY)  TROPONIN I (HIGH SENSITIVITY)    EKG EKG Interpretation Date/Time:  Saturday November 30 2022 04:19:41 EDT Ventricular Rate:  67 PR Interval:    QRS Duration:  115 QT Interval:  386 QTC Calculation: 408 R Axis:   94  Text Interpretation: Normal sinus rhythm Incomplete right bundle branch block No significant change since last tracing Confirmed by Gilda Crease [phone]) on 11/30/2022 4:52:09 AM  Radiology DG Chest 2 View  Result Date: 11/30/2022 CLINICAL DATA:  Chest pain EXAM: CHEST - 2 VIEW COMPARISON:  06/01/2018 FINDINGS: The heart size and mediastinal contours are within normal limits. Both lungs are clear. The visualized skeletal structures are unremarkable. IMPRESSION: No active cardiopulmonary disease. Electronically Signed   By: Kennith Center M.D.   On: 11/30/2022 05:00    Procedures Procedures    Medications Ordered in ED Medications - No data to display  ED Course/ Medical Decision Making/ A&P                             Medical Decision Making Amount and/or Complexity of Data Reviewed Labs: ordered. Radiology: ordered.   Presents to the emergency department for evaluation of chest pain.  Patient's only cardiac risk factor is family history of early heart disease in his father.  Symptoms today, however, are not typical.  Symptoms came on at rest, there is a pleuritic component.  EKG without ischemic changes.  He does have a right bundle branch block but this was documented some years ago as well.  Unchanged.  First Troponin negative. Will sign out to oncoming physician.        Final Clinical Impression(s) / ED Diagnoses Final diagnoses:  Chest pain, unspecified type    Rx /  DC Orders ED Discharge Orders          Ordered    Ambulatory referral to Cardiology       Comments: If you have not heard from the Cardiology office within the next 72 hours please call [phone].  11/30/22 0708              Gilda Crease, MD 11/30/22 [phone]

## 2022-11-30 NOTE — Discharge Instructions (Addendum)
All the blood work today looks really good.  There is no sign of heart attack, pneumonia or enlargement of your heart.  The pain may be from something called pleurisy which is irritation of your lung lining.  You were given an inhaler that you can use as needed for wheezing since that has happened to you at times.  It is a good idea to stop smoking.  You were given a referral to cardiology and they should call you for a follow-up because of your family history.  You can take Tylenol and ibuprofen as needed for the discomfort.

## 2022-11-30 NOTE — ED Triage Notes (Signed)
Pt arrives via [facility] with reports of onset of CP that awoke him from his sleep tonight, also having some SOB. Describes a crushing chest pain. New onset RBBB. 154/98, hr 74-84, IV established in the left ac.

## 2022-11-30 NOTE — ED Provider Notes (Signed)
I assumed care from Dr. Oletta Cohn at 7:30 AM.  Patient's repeat troponin is negative.  Had a discussion with the patient and his family members.  At this time low suspicion for PE, dissection, ACS.  Patient is a smoker and family reports at times he does wheeze.  Suspect this is most likely pleurisy.  No wheezing at this time but sats have been between 92 and 95%.  Patient was given an inhaler to use as needed for when he has episodes of wheezing and instructed to use Tylenol and ibuprofen as needed for the discomfort.  At this time with normal troponins low suspicion for myocarditis.  No EKG findings to suggest pericarditis.  No indication at this time for further testing or admission.  Feel that patient is stable for discharge home.  He and his family are comfortable with this plan.   Gwyneth Sprout, MD 11/30/22 [phone]

## 2023-07-23 ENCOUNTER — Other Ambulatory Visit: Payer: Self-pay

## 2023-07-23 ENCOUNTER — Encounter (HOSPITAL_COMMUNITY): Payer: Self-pay

## 2023-07-23 ENCOUNTER — Emergency Department (HOSPITAL_COMMUNITY)
Admission: EM | Admit: 2023-07-23 | Discharge: 2023-07-24 | Disposition: A | Discharge status: Home / Self Care | Payer: Self-pay | Attending: Emergency Medicine | Admitting: Emergency Medicine

## 2023-07-23 ENCOUNTER — Emergency Department (HOSPITAL_COMMUNITY): Payer: Self-pay

## 2023-07-23 DIAGNOSIS — H5460 Unqualified visual loss, one eye, unspecified: Secondary | ICD-10-CM

## 2023-07-23 DIAGNOSIS — Z720 Tobacco use: Secondary | ICD-10-CM | POA: Insufficient documentation

## 2023-07-23 DIAGNOSIS — H5461 Unqualified visual loss, right eye, normal vision left eye: Secondary | ICD-10-CM | POA: Insufficient documentation

## 2023-07-23 LAB — BASIC METABOLIC PANEL
Anion gap: 8 (ref 5–15)
BUN: 12 mg/dL (ref 6–20)
CO2: 26 mmol/L (ref 22–32)
Calcium: 9.4 mg/dL (ref 8.9–10.3)
Chloride: 101 mmol/L (ref 98–111)
Creatinine, Ser: 0.93 mg/dL (ref 0.61–1.24)
GFR, Estimated: >60 mL/min (ref >60)
Glucose, Bld: 92 mg/dL (ref 70–99)
Potassium: 4.3 mmol/L (ref 3.5–5.1)
Sodium: 135 mmol/L (ref 135–145)

## 2023-07-23 LAB — CBC WITH DIFFERENTIAL/PLATELET
Abs Immature Granulocytes: 0.03 10*3/uL (ref 0.00–0.07)
Basophils Absolute: 0.1 10*3/uL (ref 0.0–0.1)
Basophils Relative: 1 %
Eosinophils Absolute: 0.2 10*3/uL (ref 0.0–0.5)
Eosinophils Relative: 1 %
HCT: 47.8 % (ref 39.0–52.0)
Hemoglobin: 16.2 g/dL (ref 13.0–17.0)
Immature Granulocytes: 0 %
Lymphocytes Relative: 14 %
Lymphs Abs: 1.7 10*3/uL (ref 0.7–4.0)
MCH: 31.3 pg (ref 26.0–34.0)
MCHC: 33.9 g/dL (ref 30.0–36.0)
MCV: 92.5 fL (ref 80.0–100.0)
Monocytes Absolute: 0.8 10*3/uL (ref 0.1–1.0)
Monocytes Relative: 7 %
Neutro Abs: 8.9 10*3/uL — ABNORMAL HIGH (ref 1.7–7.7)
Neutrophils Relative %: 77 %
Platelets: 265 10*3/uL (ref 150–400)
RBC: 5.17 MIL/uL (ref 4.22–5.81)
RDW: 13.5 % (ref 11.5–15.5)
WBC: 11.7 10*3/uL — ABNORMAL HIGH (ref 4.0–10.5)
nRBC: 0 % (ref 0.0–0.2)

## 2023-07-23 MED ORDER — GADOBUTROL 1 MMOL/ML IV SOLN
7.0000 mL | Freq: Once | INTRAVENOUS | Status: AC | PRN
  Administered 2023-07-23: 7 mL via INTRAVENOUS

## 2023-07-23 NOTE — ED Provider Notes (Signed)
 [place] EMERGENCY DEPARTMENT AT [facility] Provider Note   CSN: [id number] Arrival date & time: 07/23/23  1600     History {Add pertinent medical, surgical, social history, OB history to HPI:1} Chief Complaint  Patient presents with   Eye Problem    Alan Lamb is a 33 y.o. male.  Patient is a 33 year old male who states he has no known medical problems presenting today with complaint of intermittent right painless vision loss for the last 2 weeks.  He reports that suddenly he just loses his vision which can sometimes last up to an hour but then it always comes back.  He reports that usually starts as everything in the right eye starting to look really bright and then the vision just completely goes out and everything is black.  The left eye is completely normal.  There is no pain in his eye with this.  He denies having headaches associated with the symptoms.  He has not had any neck pain or facial pain.  He denies any trauma.  Patient reports that last week he had a headache for 1 day but it was not associated with the vision.  It happens at any time during the day and is not at 1 particular period of time.  He has not had any today but had 3 episodes yesterday.  He does not use contacts or corrective lenses.  He is not putting any drops in his eyes.  He does not have any numbness or weakness to the upper or lower extremities.  No facial twitching.  He does use tobacco and marijuana but denies any alcohol or cocaine.  No family history of MS or autoimmune illnesses that he is aware of.  He denies diplopia  The history is provided by the patient.  Eye Problem      Home Medications Prior to Admission medications   Medication Sig Start Date End Date Taking? Authorizing Provider  dicyclomine (BENTYL) 20 MG tablet Take 1 tablet (20 mg total) by mouth 2 (two) times daily. 05/11/15   Pisciotta, Joni Reining, PA-C  diphenhydrAMINE (BENADRYL) 25 MG tablet Take 1 tablet (25 mg  total) by mouth at bedtime as needed. 10/15/17   Janne Napoleon, NP  famotidine (PEPCID) 20 MG tablet Take 1 tablet (20 mg total) by mouth 2 (two) times daily. 05/11/15   Pisciotta, Joni Reining, PA-C  ibuprofen (ADVIL,MOTRIN) 800 MG tablet Take 1 tablet (800 mg total) by mouth 3 (three) times daily. 06/01/18   Mesner, Barbara Cower, MD  lidocaine (XYLOCAINE) 2 % solution Use as directed 15 mLs in the mouth or throat as needed for mouth pain. 10/15/20   Khatri, Hina, PA-C  naproxen (NAPROSYN) 500 MG tablet Take 1 tablet (500 mg total) by mouth 2 (two) times daily. 10/15/20   Khatri, Hina, PA-C  ondansetron (ZOFRAN) 4 MG tablet Take 1 tablet (4 mg total) by mouth every 8 (eight) hours as needed for nausea or vomiting. 05/11/15   Pisciotta, Joni Reining, PA-C  ranitidine (ZANTAC) 150 MG tablet Take 1 tablet (150 mg total) by mouth 2 (two) times daily. 10/15/17   Janne Napoleon, NP      Allergies    Other    Review of Systems   Review of Systems  Physical Exam Updated Vital Signs BP 137/86 (BP Location: Left Arm)   Pulse 85   Temp 98.3 F (36.8 C) (Oral)   Resp 16   Ht 5\' 9"  (1.753 m)   Wt 72.6 kg  SpO2 96%   BMI 23.63 kg/m  Physical Exam Vitals and nursing note reviewed.  Constitutional:      General: He is not in acute distress.    Appearance: He is well-developed.  HENT:     Head: Normocephalic and atraumatic.     Comments: No temporal artery tenderness    Mouth/Throat:     Mouth: Mucous membranes are moist.  Eyes:     Extraocular Movements: Extraocular movements intact.     Conjunctiva/sclera: Conjunctivae normal.     Pupils: Pupils are equal, round, and reactive to light.     Comments: Pupils are reactive bilaterally with consensual pupillary constriction as well.  No evidence of afferent pupillary deficit  Neck:     Vascular: No carotid bruit.  Cardiovascular:     Rate and Rhythm: Normal rate and regular rhythm.     Heart sounds: No murmur heard. Pulmonary:     Effort: Pulmonary effort is  normal. No respiratory distress.     Breath sounds: Normal breath sounds. No wheezing or rales.  Musculoskeletal:        General: No tenderness. Normal range of motion.     Cervical back: Normal range of motion and neck supple.  Skin:    General: Skin is warm and dry.     Findings: No erythema or rash.  Neurological:     Mental Status: He is alert and oriented to person, place, and time. Mental status is at baseline.     Cranial Nerves: No cranial nerve deficit.     Sensory: No sensory deficit.     Motor: No weakness.     Coordination: Coordination normal.     Gait: Gait normal.  Psychiatric:        Behavior: Behavior normal.     ED Results / Procedures / Treatments   Labs (all labs ordered are listed, but only abnormal results are displayed) Labs Reviewed  CBC WITH DIFFERENTIAL/PLATELET - Abnormal; Notable for the following components:      Result Value   WBC 11.7 (*)    Neutro Abs 8.9 (*)    All other components within normal limits  BASIC METABOLIC PANEL    EKG None  Radiology No results found.  Procedures Procedures  {Document cardiac monitor, telemetry assessment procedure when appropriate:1}  Medications Ordered in ED Medications - No data to display  ED Course/ Medical Decision Making/ A&P   {   Click here for ABCD2, HEART and other calculatorsREFRESH Note before signing :1}                              Medical Decision Making Amount and/or Complexity of Data Reviewed Radiology: ordered.   Pt presenting today with a complaint that caries a high risk for morbidity and mortality. Presenting today with intermittent monocular vision loss that is been painless.  Lower suspicion for retinal hemorrhage, retinal artery or vein occlusion.  Concern for MS, optic neuritis possibility could be from a complex migraine but patient does not typically have headaches associated with the vision loss.  Also concern for possible schwannoma or other brain lesion.  Lower  suspicion for aneurysm.  Symptoms are not classic for myasthenia gravis. I independently interpreted patient's labs and BMP within normal limits, CBC with a white count of 11 but otherwise normal.  Will get MRI of the brain and orbits for further evaluation.  Currently patient is asymptomatic and his exam is normal.  {  Document critical care time when appropriate:1} {Document review of labs and clinical decision tools ie heart score, Chads2Vasc2 etc:1}  {Document your independent review of radiology images, and any outside records:1} {Document your discussion with family members, caretakers, and with consultants:1} {Document social determinants of health affecting pt's care:1} {Document your decision making why or why not admission, treatments were needed:1} Final Clinical Impression(s) / ED Diagnoses Final diagnoses:  None    Rx / DC Orders ED Discharge Orders     None

## 2023-07-23 NOTE — ED Provider Triage Note (Signed)
 Emergency Medicine Provider Triage Evaluation Note  Alan Lamb , a 33 y.o. male  was evaluated in triage.  Pt complains of vision loss in right eye.  Review of Systems  Positive: Intermittent vision loss multiple times a day x 3 weeks Negative: Pain, fever, hormone use, recent travel, surgery, injury  Physical Exam  BP 137/86 (BP Location: Left Arm)   Pulse 85   Temp 98.3 F (36.8 C) (Oral)   Resp 16   Ht 5\' 9"  (1.753 m)   Wt 72.6 kg   SpO2 96%   BMI 23.63 kg/m  Gen:   Awake, no distress   Resp:  Normal effort  MSK:   Moves extremities without difficulty  Other:  Vision grossly intact  Medical Decision Making  Medically screening exam initiated at 4:13 PM.  Appropriate orders placed.  Davide L Milks was informed that the remainder of the evaluation will be completed by another provider, this initial triage assessment does not replace that evaluation, and the importance of remaining in the ED until their evaluation is complete.  Imaging and labs ordered   Gretta Began 07/23/23 [id number]

## 2023-07-23 NOTE — ED Triage Notes (Signed)
 Right eye has been losing vision for about an hour a few times a day for the last 3 weeks. On arrival, pt has complete vision. Denies pain

## 2023-07-23 NOTE — ED Notes (Signed)
 Patient apparently walked out with visitor by cleaning staff. Tried to look for patient and was unsuccessful. Patient does still have IV in place. Was unable to find it in the room.

## 2023-10-30 ENCOUNTER — Ambulatory Visit: Payer: Self-pay

## 2024-03-18 ENCOUNTER — Ambulatory Visit: Admitting: Family Medicine

## 2024-05-24 ENCOUNTER — Ambulatory Visit: Admitting: Family Medicine
